# Patient Record
Sex: Male | Born: 1970 | Race: White | Hispanic: No | Marital: Single | Smoking: Former smoker
Health system: Southern US, Community
[De-identification: ages and names within clinical notes are randomized; demographics above are authoritative.]

## PROBLEM LIST (undated history)

## (undated) DIAGNOSIS — M6789 Other specified disorders of synovium and tendon, multiple sites: Secondary | ICD-10-CM

## (undated) DIAGNOSIS — I10 Essential (primary) hypertension: Secondary | ICD-10-CM

## (undated) DIAGNOSIS — M6749 Ganglion, multiple sites: Secondary | ICD-10-CM

## (undated) DIAGNOSIS — M7139 Other bursal cyst, multiple sites: Secondary | ICD-10-CM

## (undated) DIAGNOSIS — E785 Hyperlipidemia, unspecified: Secondary | ICD-10-CM

## (undated) HISTORY — DX: Other specified disorders of synovium and tendon, multiple sites: M67.89

## (undated) HISTORY — DX: Essential (primary) hypertension: I10

## (undated) HISTORY — PX: ORTHOPEDIC SURGERY: SHX850

## (undated) HISTORY — DX: Hyperlipidemia, unspecified: E78.5

## (undated) HISTORY — DX: Ganglion, multiple sites: M67.49

## (undated) HISTORY — DX: Other bursal cyst, multiple sites: M71.39

---

## 2005-06-02 ENCOUNTER — Emergency Department (HOSPITAL_COMMUNITY): Admission: EM | Admit: 2005-06-02 | Discharge: 2005-06-02 | Payer: Self-pay | Admitting: Emergency Medicine

## 2010-01-04 ENCOUNTER — Encounter: Admission: RE | Admit: 2010-01-04 | Discharge: 2010-01-04 | Payer: Self-pay | Admitting: Occupational Medicine

## 2010-12-26 ENCOUNTER — Ambulatory Visit (HOSPITAL_BASED_OUTPATIENT_CLINIC_OR_DEPARTMENT_OTHER)
Admission: RE | Admit: 2010-12-26 | Discharge: 2010-12-26 | Disposition: A | Discharge status: Home / Self Care | Payer: Worker's Compensation | Source: Ambulatory Visit | Attending: Orthopedic Surgery | Admitting: Orthopedic Surgery

## 2010-12-26 DIAGNOSIS — M6740 Ganglion, unspecified site: Secondary | ICD-10-CM | POA: Insufficient documentation

## 2010-12-26 DIAGNOSIS — Z01812 Encounter for preprocedural laboratory examination: Secondary | ICD-10-CM | POA: Insufficient documentation

## 2010-12-26 LAB — POCT I-STAT, CHEM 8
Chloride: 102 mEq/L (ref 96–112)
HCT: 44 % (ref 39.0–52.0)
Hemoglobin: 15 g/dL (ref 13.0–17.0)

## 2010-12-26 LAB — POCT HEMOGLOBIN-HEMACUE: Hemoglobin: 15.2 g/dL (ref 13.0–17.0)

## 2010-12-29 NOTE — Op Note (Signed)
NAMEKAGE, WILLMANN NO.:  [id number]  MEDICAL RECORD NO.:  [id number]  LOCATION:                                 FACILITY:  PHYSICIAN:  Katy Fitch. Riti Rollyson, M.D.      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  OPERATING SURGEON:  Katy Fitch. Shakyia Bosso, MD  ASSISTANT:  None.  ANESTHESIA:  General by LMA.  Supervising anesthesiologist is Burna Forts, MD  INDICATIONS:  Drew Cabrera is a 40 year old, employed at the [employer], referred by [facility] for evaluation and management of a painful right wrist.  Clinical examination revealed a fullness on the dorsum of the wrist, suggesting possible myxoid cyst formation within the capsule intracapsularly or deep to the extensor mechanisms.  Due to persistent pain, we advised Drew Cabrera to obtain an MRI to look for signs of occult wrist pathology.  The MRI did document a multiseptated myxoid cyst predicament on the dorsal aspect of the carpus.  This was centered near the neck of the capitate.  We advised Drew Cabrera to endure this as long as he is able to but when his symptoms became intolerable, his cyst could be excised without difficulty.  After informed consent, he was brought to the operating room at this time.  Preoperatively, he was reminded that myxoid cysts have tendency to recur. There is a poor understanding of the basic pathophysiology.  They appeared to be degenerative in nature due to alteration in  collagen metabolism.  Preoperatively, he was advised of potential risks, benefits of anesthesia by Dr. Jacklynn Bue of Anesthesia Service.  General anesthesia by LMA technique was recommended and accepted.  DESCRIPTION OF PROCEDURE:  Drew Cabrera was brought to room 2 of the [facility] and placed in supine position on the operating table.  Following the induction of general anesthesia by LMA technique, the right wrist was prepped with Betadine soap solution, sterilely draped. Pneumatic tourniquet was applied to proximal right brachium.  On exsanguination of the right arm with Esmarch bandage, the arterial tourniquet was inflated to 250 mmHg.  Following routine surgical time- out, procedure commenced with a transverse incision directly over the dorsum of the capitate.  Subcutaneous tissues were carefully divided, taking care to identify the extensor retinaculum.  This was split transversely and the extensor tendon in the second and fourth dorsal compartment retracted radially and ulnarly.  The dorsal capsule was identified.  The dorsal carpal arch identified and spared.  A transverse capsulotomy was performed and with great care, a complex cluster of 3 myxoid cyst was identified, circumferentially dissected and removed off the capsule of the midcarpal joint.  The area of mucin infiltration was resected with the capsular tissues.  Care was taken to explore the entire dorsum of the  wrist including the region of the scapholunate ligament in an effort to be certain that we saw all portions of the cyst noted on the preoperative MRI.  After thorough debridement, bipolar cautery forceps were used for hemostasis.  The dorsal carpal arch was preserved as were all of its branches to the carpal bones.  The wound was then repaired with subcutaneous suture of 4-0 Vicryl and intradermal 3-0 Prolene suture.  A compressive dressing was applied with volar plaster splint maintaining wrist  in 5 degrees of dorsiflexion.  For aftercare, [name] was provided prescription for Percocet 5 mg 1 p.o. q.4-6 hours p.r.n. pain, 30 tablets without refill.     Katy Fitch Lexis Potenza, M.D.     RVS/MEDQ  D:  12/26/2010  T:  12/27/2010  Job:  [id number]  Electronically Signed by Josephine Igo M.D. on 12/29/2010 08:42:30 AM

## 2011-07-29 ENCOUNTER — Emergency Department (INDEPENDENT_AMBULATORY_CARE_PROVIDER_SITE_OTHER)
Admission: EM | Admit: 2011-07-29 | Discharge: 2011-07-29 | Disposition: A | Discharge status: Home / Self Care | Payer: 59 | Source: Home / Self Care | Attending: Emergency Medicine | Admitting: Emergency Medicine

## 2011-07-29 DIAGNOSIS — J069 Acute upper respiratory infection, unspecified: Secondary | ICD-10-CM

## 2011-07-29 MED ORDER — AZITHROMYCIN 250 MG PO TABS: ORAL_TABLET | ORAL | Status: AC

## 2011-07-29 NOTE — ED Provider Notes (Signed)
History     CSN: [id number]  Arrival date & time 07/29/11  1542   First MD Initiated Contact with Patient 07/29/11 1603      No chief complaint on file.   (Consider location/radiation/quality/duration/timing/severity/associated sxs/prior treatment) HPI Wing is a 41 y.o. male who complains of onset of cold symptoms for 7 days. Worsening last 3 days. +sore throat / hoarseness + cough No pleuritic pain No wheezing + nasal congestion + post-nasal drainage + sinus pain/pressure No chest congestion No itchy/red eyes No earache No hemoptysis No SOB No chills/sweats No fever No nausea No vomiting No abdominal pain No diarrhea No skin rashes No fatigue No myalgias No headache    No past medical history on file.  No past surgical history on file.  No family history on file.  History  Substance Use Topics  . Smoking status: Not on file  . Smokeless tobacco: Not on file  . Alcohol Use: Not on file      Review of Systems  All other systems reviewed and are negative.    Allergies  Review of patient's allergies indicates not on file.  Home Medications  No current outpatient prescriptions on file.  There were no vitals taken for this visit.  Physical Exam  Nursing note and vitals reviewed. Constitutional: He is oriented to person, place, and time. He appears well-developed and well-nourished.  HENT:  Head: Normocephalic and atraumatic.  Right Ear: Tympanic membrane, external ear and ear canal normal.  Left Ear: Tympanic membrane, external ear and ear canal normal.  Nose: Mucosal edema and rhinorrhea present.  Mouth/Throat: Posterior oropharyngeal erythema present. No oropharyngeal exudate or posterior oropharyngeal edema.  Eyes: No scleral icterus.  Neck: Neck supple.  Cardiovascular: Regular rhythm and normal heart sounds.   Pulmonary/Chest: Effort normal and breath sounds normal. No respiratory distress.  Neurological: He is alert and oriented to  person, place, and time.  Skin: Skin is warm and dry.  Psychiatric: He has a normal mood and affect. His speech is normal.    ED Course  Procedures (including critical care time)  Labs Reviewed - No data to display No results found.   No diagnosis found.    MDM  1)  Take the prescribed antibiotic as instructed.  If not better in a few days, would Rx Prednisone. 2)  Use nasal saline solution (over the counter) at least 3 times a day. 3)  Use over the counter decongestants like Zyrtec-D every 12 hours as needed to help with congestion.  If you have hypertension, do not take medicines with sudafed.  4)  Can take tylenol every 6 hours or motrin every 8 hours for pain or fever. 5)  Follow up with your primary doctor if no improvement in 5-7 days, sooner if increasing pain, fever, or new symptoms.       Lily Kocher, MD 07/29/11 [phone]

## 2011-07-29 NOTE — ED Notes (Signed)
Pt c/o head and chest congestion for past week.  Using mucinex at home with no relief.

## 2011-12-11 ENCOUNTER — Ambulatory Visit (INDEPENDENT_AMBULATORY_CARE_PROVIDER_SITE_OTHER): Payer: 59 | Admitting: Family Medicine

## 2011-12-11 ENCOUNTER — Encounter: Payer: Self-pay | Admitting: Family Medicine

## 2011-12-11 VITALS — BP 152/94 | HR 68 | Ht 69.0 in | Wt 201.0 lb

## 2011-12-11 DIAGNOSIS — G5602 Carpal tunnel syndrome, left upper limb: Secondary | ICD-10-CM | POA: Insufficient documentation

## 2011-12-11 DIAGNOSIS — I10 Essential (primary) hypertension: Secondary | ICD-10-CM

## 2011-12-11 DIAGNOSIS — M79609 Pain in unspecified limb: Secondary | ICD-10-CM

## 2011-12-11 DIAGNOSIS — G8929 Other chronic pain: Secondary | ICD-10-CM

## 2011-12-11 DIAGNOSIS — M67439 Ganglion, unspecified wrist: Secondary | ICD-10-CM | POA: Insufficient documentation

## 2011-12-11 DIAGNOSIS — M6740 Ganglion, unspecified site: Secondary | ICD-10-CM

## 2011-12-11 DIAGNOSIS — M67441 Ganglion, right hand: Secondary | ICD-10-CM

## 2011-12-11 DIAGNOSIS — E785 Hyperlipidemia, unspecified: Secondary | ICD-10-CM

## 2011-12-11 MED ORDER — MELOXICAM 15 MG PO TABS: 15.0000 mg | ORAL_TABLET | Freq: Every day | ORAL | Status: DC

## 2011-12-11 MED ORDER — TELMISARTAN-HCTZ 40-12.5 MG PO TABS: 1.0000 | ORAL_TABLET | Freq: Every day | ORAL | Status: DC

## 2011-12-11 MED ORDER — HYDROCODONE-ACETAMINOPHEN 5-325 MG PO TABS: 1.0000 | ORAL_TABLET | Freq: Four times a day (QID) | ORAL | Status: DC | PRN

## 2011-12-11 MED ORDER — ASPIRIN 81 MG PO TABS: 81.0000 mg | ORAL_TABLET | Freq: Every day | ORAL | Status: DC

## 2011-12-11 NOTE — Patient Instructions (Signed)
Triglycerides, TG, TRIG This is a test to check your risk of developing heart disease. It is often done as part of a lipid profile during a regular medical exam or if you are being treated for high triglycerides. This test measures the amount of triglycerides in your blood. Triglycerides are the body's storage form for fat. Most triglycerides are found in fat tissue. Some triglycerides circulate in the blood to provide fuel for muscles to work. Extra triglycerides are found in the blood after eating a meal when fat is being sent from the gut to fat tissue for storage. The test for triglycerides should be done when you are fasting and no extra triglycerides from a recent meal are present.  SAMPLE COLLECTION The test for triglycerides uses a blood sample. Most often, the blood sample is collected using a needle to collect blood from a vein. Sometimes triglycerides are measured using a drop of blood collected by puncturing the skin on a finger. Testing should be done when you are fasting. For 12 to 14 hours before the test, only water is permitted. In addition, alcohol should not be consumed for the 24 hours just before the test. If you are diabetic and your blood sugar is out of control, triglycerides will be very high. NORMAL FINDINGS  Adult/elderly   Male: 40-160 mg/dL or 1.61-0.96 mmol/L (SI units)   Male: 35-135 mg/dL or 0.45-4.09 mmol/L (SI units)   0-42 years   Male: 30-86 mg/dL   Male: 81-19 mg/dL   1-42 years   Male: 82-956 mg/dL   Male: 21-308 mg/dL   65-42 years   Male: 46-962 mg/dL   Male: 95-284 mg/dL   13-42 years   Male: 40-102 mg/dL   Male: 72-536 mg/dL  Ranges for normal findings may vary among different laboratories and hospitals. You should always check with your doctor after having lab work or other tests done to discuss the meaning of your test results and whether your values are considered within normal limits. MEANING OF TEST  Your caregiver will go  over the test results with you and discuss the importance and meaning of your results, as well as treatment options and the need for additional tests if necessary. OBTAINING THE TEST RESULTS It is your responsibility to obtain your test results. Ask the lab or department performing the test when and how you will get your results. Document Released: 07/07/2004 Document Revised: 05/24/2011 Document Reviewed: 05/16/2008 [facility] Patient Information 2012 [place], [place].

## 2011-12-11 NOTE — Progress Notes (Signed)
Subjective:    Patient ID: Drew Cabrera, male    DOB: 06-Apr-1971, 41 y.o.   MRN: [id number]  Hypertension This is a new problem. The current episode started 1 to 4 weeks ago. The problem is unchanged. The problem is uncontrolled. Pertinent negatives include no anxiety, blurred vision, chest pain, headaches, malaise/fatigue, neck pain, orthopnea, palpitations, peripheral edema, PND, shortness of breath or sweats. There are no associated agents to hypertension. Risk factors: stress test  2 years ago. Past treatments include nothing. Compliance problems include exercise.  Hypertensive end-organ damage includes left ventricular hypertrophy. There is no history of angina, kidney disease, CAD/MI, CVA, heart failure, PVD, renovascular disease, retinopathy or a thyroid problem. There is no history of chronic renal disease, coarctation of the aorta, hyperaldosteronism, hypercortisolism, hyperparathyroidism, a hypertension causing med, pheochromocytoma or sleep apnea.  Hyperlipidemia This is a new problem. The current episode started more than 1 month ago. The problem is uncontrolled. Recent lipid tests were reviewed and are high. He has no history of chronic renal disease. Pertinent negatives include no chest pain or shortness of breath. He is currently on no antihyperlipidemic treatment. There are no compliance problems.  Risk factors for coronary artery disease include dyslipidemia, family history and a sedentary lifestyle.   #3 he also reports ganglion present over his right wrist that he states he followed up by his orthopedic who has done surgery on him for the ganglion within the last 2 years. #4 multiple joint pain especially wrist hand. Bowel ganglion with part of the injury work using air compresses he reports some pain in his ankle that has occurred from injuries where he almost lost his right foot to pain in both hands due to overuse at work. Reports sometimes when he comes homes he will have pain in  both hands after a day of work. One of his previous doctor he states would give him 300  tablets which would use overuse time he is in the 100 tablets in a year. He states that using the 200 pharmacy and this medicine that he takes every day. He has been taking aspirin 325 mg 2-3 times a day for his pain currently.  Review of Systems  Constitutional: Negative for malaise/fatigue.  HENT: Negative for neck pain.   Eyes: Negative for blurred vision.  Respiratory: Negative for shortness of breath.   Cardiovascular: Negative for chest pain, palpitations, orthopnea and PND.  Musculoskeletal:       R hand ganglion  Neurological: Negative for headaches.    Allergies  Allergen Reactions  . Ivp Dye (Iodinated Diagnostic Agents)    History   Social History  . Marital Status: Single    Spouse Name: N/A    Number of Children: N/A  . Years of Education: N/A   Occupational History  . Not on file.   Social History Main Topics  . Smoking status: Former Smoker    Types: Cigarettes    Quit date: 04/21/2008  . Smokeless tobacco: Former Neurosurgeon  . Alcohol Use: No  . Drug Use: Not on file  . Sexually Active: Yes   Other Topics Concern  . Not on file   Social History Narrative  . No narrative on file   Family History  Problem Relation Age of Onset  . Thyroid disease Mother   . Stroke Father    Past Medical History  Diagnosis Date  . Ganglion and cyst of synovium, tendon and bursa   . Hypertension   . Hyperlipidemia  Past Surgical History  Procedure Date  . Orthopedic surgery        BP 152/94  Pulse 68  Ht 5\' 9"  (1.753 m)  Wt 201 lb (91.173 kg)  BMI 29.68 kg/m2  SpO2 98% Objective:   Physical Exam  Constitutional: He is oriented to person, place, and time. He appears well-developed and well-nourished.  HENT:  Head: Normocephalic.  Eyes: Pupils are equal, round, and reactive to light.  Neck: Neck supple.  Cardiovascular: Normal rate, regular rhythm and normal heart  sounds.   Pulmonary/Chest: Effort normal.  Musculoskeletal:       Ganglion is present over the right wrist. No marked tenderness is palpated over the hands  The patient has trouble with at this time.  Mark surgical changes present over his ankles.    Neurological: He is alert and oriented to person, place, and time.  Skin: Skin is warm and dry.  Psychiatric: He has a normal mood and affect. His behavior is normal.       Assessment & Plan:  #1 hypertension. In reviewing his documentation from work and blood pressure today I feel comfortable with the diagnosis of hypertension. We will place him on Micardis return in 2 months for followup. Discussed with him how we place on low dose of multiple agents to treat hypertension. Also went over potential side effects #2 hyperlipidemia. Patient was not truly fasting but is doubtful that milk and cereal and [facility] caused triglycerides of over 600. Once again time spent talking to him about diet and the importance of lowering his triglycerides and cholesterol. Before we place her on medication I will recheck his cholesterol again. He is to return fasting to have blood work done #3 ganglion of the wrist is going to followup with his orthopedic who did initial surgery #4 chronic hand and joint pain most work will give him a prescription of hydrocodone 5-25 #30 And allow to fill once a month. We'll stop his aspirin to 3 times a day place on Mobic 15 mg a day and change aspirin to 81 mg daily to prevent strokes and cardiovascular events. Return in 2 months for followup. Extensive time was spent in counseling of above issues.

## 2011-12-12 LAB — COMPLETE METABOLIC PANEL WITH GFR
ALT: 45 U/L (ref 0–53)
AST: 35 U/L (ref 0–37)
Albumin: 4.9 g/dL (ref 3.5–5.2)
Alkaline Phosphatase: 88 U/L (ref 39–117)
BUN: 14 mg/dL (ref 6–23)
CO2: 26 mEq/L (ref 19–32)
Calcium: 9.9 mg/dL (ref 8.4–10.5)
Chloride: 101 mEq/L (ref 96–112)
Creat: 1.21 mg/dL (ref 0.50–1.35)
GFR, Est African American: 85 mL/min
GFR, Est Non African American: 74 mL/min
Glucose, Bld: 91 mg/dL (ref 70–99)
Potassium: 4.1 mEq/L (ref 3.5–5.3)
Sodium: 139 mEq/L (ref 135–145)
Total Bilirubin: 0.7 mg/dL (ref 0.3–1.2)
Total Protein: 7.3 g/dL (ref 6.0–8.3)

## 2011-12-12 LAB — HEMOGLOBIN A1C: Hgb A1c MFr Bld: 5.5 % (ref <5.7)

## 2011-12-12 LAB — LIPID PANEL
Cholesterol: 201 mg/dL — ABNORMAL HIGH (ref 0–200)
HDL: 28 mg/dL — ABNORMAL LOW (ref >39)
Total CHOL/HDL Ratio: 7.2 Ratio
Triglycerides: 628 mg/dL — ABNORMAL HIGH (ref <150)

## 2011-12-12 LAB — CBC WITH DIFFERENTIAL/PLATELET
Basophils Relative: 1 % (ref 0–1)
Lymphocytes Relative: 28 % (ref 12–46)
Lymphs Abs: 1.3 10*3/uL (ref 0.7–4.0)
MCHC: 35.6 g/dL (ref 30.0–36.0)
Monocytes Absolute: 0.4 10*3/uL (ref 0.1–1.0)
Monocytes Relative: 8 % (ref 3–12)
Neutro Abs: 2.8 10*3/uL (ref 1.7–7.7)
Neutrophils Relative %: 60 % (ref 43–77)
RDW: 14.1 % (ref 11.5–15.5)

## 2011-12-12 LAB — TSH: TSH: 32.416 u[IU]/mL — ABNORMAL HIGH (ref 0.350–4.500)

## 2012-02-12 ENCOUNTER — Ambulatory Visit (INDEPENDENT_AMBULATORY_CARE_PROVIDER_SITE_OTHER): Payer: 59 | Admitting: Family Medicine

## 2012-02-12 ENCOUNTER — Encounter: Payer: Self-pay | Admitting: Family Medicine

## 2012-02-12 VITALS — BP 106/72 | HR 64 | Ht 69.0 in | Wt 200.0 lb

## 2012-02-12 DIAGNOSIS — Z23 Encounter for immunization: Secondary | ICD-10-CM

## 2012-02-12 DIAGNOSIS — E789 Disorder of lipoprotein metabolism, unspecified: Secondary | ICD-10-CM

## 2012-02-12 DIAGNOSIS — R252 Cramp and spasm: Secondary | ICD-10-CM

## 2012-02-12 DIAGNOSIS — E039 Hypothyroidism, unspecified: Secondary | ICD-10-CM

## 2012-02-12 DIAGNOSIS — I10 Essential (primary) hypertension: Secondary | ICD-10-CM

## 2012-02-12 MED ORDER — HYDROCODONE-ACETAMINOPHEN 5-325 MG PO TABS: 1.0000 | ORAL_TABLET | Freq: Four times a day (QID) | ORAL | Status: DC | PRN

## 2012-02-12 MED ORDER — MAGNESIUM OXIDE 400 MG PO TABS: 800.0000 mg | ORAL_TABLET | Freq: Two times a day (BID) | ORAL | Status: AC | PRN

## 2012-02-12 MED ORDER — LEVOTHYROXINE SODIUM 75 MCG PO TABS: 75.0000 ug | ORAL_TABLET | Freq: Every day | ORAL | Status: DC

## 2012-02-12 MED ORDER — FENOFIBRATE 150 MG PO CAPS: 30.0000 | ORAL_CAPSULE | Freq: Every day | ORAL | Status: DC

## 2012-02-12 NOTE — Progress Notes (Signed)
Subjective:    Patient ID: Drew Cabrera, male    DOB: [birth year], 41 y.o.   MRN: [id number]  HPI #1 leg cramps/ muscle cramps he reports having some leg cramps muscle cramps. States that the legs do not draw up completely his legs feel as if they are about too cramp.  #2 overall leg pain and hand pain seemed to improve on the Mobic. He reports some GI discomfort the first 2 days and taking the Mobic but now he takes it in the evening and he seems to be doing fine. He does report some pain in the palmar surface of his hand after active use of his hand at work and sometimes his fingers lock up. Discussed about cortisone injections for possible trigger point but he's not too thrilled with that.   #3 hypertension. He reports in 2 days of reduction improvement of his blood pressure reports some decrease sexual excitement but nothing that interferes with his sex life at this time. He did state he was very impressed with the reduction of his blood pressure that he found when he checked it at work.  #4 blood work showed hypothyroidism. His mother had a history of hypothyroidism but he has never been diagnosed for that  #5 hyperlipidemia. Elevated triglycerides have been found before. But he is not taking any medications for that at this time.  #6 according to maintenance status he is due for a tetanus shot he is sure that he has had one since 1992 but since he cannot give [place] the date he will go ahead and get one today.   Review of Systems    BP 106/72  Pulse 64  Ht 5\' 9"  (1.753 m)  Wt 200 lb (90.719 kg)  BMI 29.53 kg/m2  SpO2 96% Objective:   Physical Exam  Vitals reviewed. Constitutional: He is oriented to person, place, and time. He appears well-developed and well-nourished.  HENT:  Head: Normocephalic and atraumatic.  Cardiovascular: Normal rate, regular rhythm and normal heart sounds.   Musculoskeletal: Normal range of motion.  Neurological: He is alert and oriented to person, place,  and time.  Skin: Skin is warm and dry.  Psychiatric: He has a normal mood and affect.        Results for orders placed in visit on 12/11/11  LIPID PANEL      Component Value Range   Cholesterol 201 (*) 0 - 200 mg/dL   Triglycerides 409 (*) <150 mg/dL   HDL 28 (*) >81 mg/dL   Total CHOL/HDL Ratio 7.2     VLDL NOT CALC  0 - 40 mg/dL   LDL Cholesterol      COMPLETE METABOLIC PANEL WITH GFR      Component Value Range   Sodium 139  135 - 145 mEq/L   Potassium 4.1  3.5 - 5.3 mEq/L   Chloride 101  96 - 112 mEq/L   CO2 26  19 - 32 mEq/L   Glucose, Bld 91  70 - 99 mg/dL   BUN 14  6 - 23 mg/dL   Creat 1.91  4.78 - 2.95 mg/dL   Total Bilirubin 0.7  0.3 - 1.2 mg/dL   Alkaline Phosphatase 88  39 - 117 U/L   AST 35  0 - 37 U/L   ALT 45  0 - 53 U/L   Total Protein 7.3  6.0 - 8.3 g/dL   Albumin 4.9  3.5 - 5.2 g/dL   Calcium 9.9  8.4 - 62.1 mg/dL  GFR, Est African American 85     GFR, Est Non African American 74    CBC WITH DIFFERENTIAL      Component Value Range   WBC 4.6  4.0 - 10.5 K/uL   RBC 5.71  4.22 - 5.81 MIL/uL   Hemoglobin 16.5  13.0 - 17.0 g/dL   HCT 95.6  38.7 - 56.4 %   MCV 81.1  78.0 - 100.0 fL   MCH 28.9  26.0 - 34.0 pg   MCHC 35.6  30.0 - 36.0 g/dL   RDW 33.2  95.1 - 88.4 %   Platelets 226  150 - 400 K/uL   Neutrophils Relative 60  43 - 77 %   Neutro Abs 2.8  1.7 - 7.7 K/uL   Lymphocytes Relative 28  12 - 46 %   Lymphs Abs 1.3  0.7 - 4.0 K/uL   Monocytes Relative 8  3 - 12 %   Monocytes Absolute 0.4  0.1 - 1.0 K/uL   Eosinophils Relative 3  0 - 5 %   Eosinophils Absolute 0.1  0.0 - 0.7 K/uL   Basophils Relative 1  0 - 1 %   Basophils Absolute 0.0  0.0 - 0.1 K/uL   Smear Review Criteria for review not met    TSH      Component Value Range   TSH 32.416 (*) 0.350 - 4.500 uIU/mL  HEMOGLOBIN A1C      Component Value Range   Hemoglobin A1C 5.5  <5.7 %   Mean Plasma Glucose 111  <117 mg/dL   vi Assessment & Plan:   #1 leg cramps and muscle cramps. I'm going to  recommend mag oxide that he can get definite [facility] 2 tablets a day for the next 3-4 days and then use on a when necessary basis he cannot stay to twice a day if need be but anytime he has cramps recommending at least one dose that day   #2 leg pain. The leg pain is actually better with the Mobic and has actually helped the hands 2 he does have some trigger point pain in the hand as if he has a trigger joint. May consider referring him to Dr. Karie Schwalbe. her for injections of those trigger points otherwise he can keep his the Mobic and Vicodin as needed.   #3 hypertension. Micardis she not causing side effects and has his blood pressure down which is great we'll continue him on the Micardis 40/12.5   #4 hypothyroidism. TSH was 32.4 indicating a pretty significant hypothyroidism. That may also cause trouble with his metabolism so I'm going to recommend Synthroid 0.75 mg or 75 mcg one tablet a day and recheck his thyroid panel are TSH in about 3 months and see if it makes a difference.  #5 hyperlipidemia. He was concerned about his liver enzymes since one tablet elevated but last lab work and look fine. But his triglycerides is over 600 so I'm going to recommendfenofibrate or one of the similar drugs to that to get started on lowering his triglycerides. He may want up on 2 medications to get the triglycerides back to more normal level. Will start him on Lipofen 150 mg a day  #6 immunization needs. Patient needs a tetanus immunization.  Followup in 3-4 months. We'll probably get TSH and triglycerides done at that time.

## 2012-02-12 NOTE — Patient Instructions (Addendum)
Leg Cramps Leg cramps that occur during exercise can be caused by poor circulation or dehydration. However, muscle cramps that occur at rest or during the night are usually not due to any serious medical problem. Heat cramps may cause muscle spasms during hot weather.  CAUSES There is no clear cause for muscle cramps. However, dehydration may be a factor for those who do not drink enough fluids and those who exercise in the heat. Imbalances in the level of sodium, potassium, calcium or magnesium in the muscle tissue may also be a factor. Some medications, such as water pills (diuretics), may cause loss of chemicals that the body needs (like sodium and potassium) and cause muscle cramps. TREATMENT   Make sure your diet has enough fluids and essential minerals for the muscle to work normally.   Avoid strenuous exercise for several days if you have been having frequent leg cramps.   Stretch and massage the cramped muscle for several minutes.   Some medicines may be helpful in some patients with night cramps. Only take over-the-counter or prescription medicines as directed by your caregiver.  SEEK IMMEDIATE MEDICAL CARE IF:   Your leg cramps become worse.   Your foot becomes cold, numb, or blue.  Document Released: 07/12/2004 Document Revised: 05/24/2011 Document Reviewed: 06/29/2008 [facility] Patient Information 2012 [place], [place].Triglycerides, TG, TRIG This is a test to check your risk of developing heart disease. It is often done as part of a lipid profile during a regular medical exam or if you are being treated for high triglycerides. This test measures the amount of triglycerides in your blood. Triglycerides are the body's storage form for fat. Most triglycerides are found in fat tissue. Some triglycerides circulate in the blood to provide fuel for muscles to work. Extra triglycerides are found in the blood after eating a meal when fat is being sent from the gut to fat tissue for storage.  The test for triglycerides should be done when you are fasting and no extra triglycerides from a recent meal are present.  SAMPLE COLLECTION The test for triglycerides uses a blood sample. Most often, the blood sample is collected using a needle to collect blood from a vein. Sometimes triglycerides are measured using a drop of blood collected by puncturing the skin on a finger. Testing should be done when you are fasting. For 12 to 14 hours before the test, only water is permitted. In addition, alcohol should not be consumed for the 24 hours just before the test. If you are diabetic and your blood sugar is out of control, triglycerides will be very high. NORMAL FINDINGS  Adult/elderly   Male: 40-160 mg/dL or 1.32-4.40 mmol/L (SI units)   Male: 35-135 mg/dL or 1.02-7.25 mmol/L (SI units)   0-42 years   Male: 30-86 mg/dL   Male: 36-64 mg/dL   4-42 years   Male: 47-425 mg/dL   Male: 95-638 mg/dL   75-42 years   Male: 33-295 mg/dL   Male: 18-841 mg/dL   66-42 years   Male: 30-160 mg/dL   Male: 10-932 mg/dL  Ranges for normal findings may vary among different laboratories and hospitals. You should always check with your doctor after having lab work or other tests done to discuss the meaning of your test results and whether your values are considered within normal limits. MEANING OF TEST  Your caregiver will go over the test results with you and discuss the importance and meaning of your results, as well as treatment options and the need  for additional tests if necessary. OBTAINING THE TEST RESULTS It is your responsibility to obtain your test results. Ask the lab or department performing the test when and how you will get your results. Document Released: 07/07/2004 Document Revised: 05/24/2011 Document Reviewed: 05/16/2008 [facility] Patient Information 2012 [place], [place].Hypothyroidism The thyroid is a large gland located in the lower front of your neck. The thyroid  gland helps control metabolism. Metabolism is how your body handles food. It controls metabolism with the hormone thyroxine. When this gland is underactive (hypothyroid), it produces too little hormone.  CAUSES These include:   Absence or destruction of thyroid tissue.   Goiter due to iodine deficiency.   Goiter due to medications.   Congenital defects (since birth).   Problems with the pituitary. This causes a lack of TSH (thyroid stimulating hormone). This hormone tells the thyroid to turn out more hormone.  SYMPTOMS  Lethargy (feeling as though you have no energy)   Cold intolerance   Weight gain (in spite of normal food intake)   Dry skin   Coarse hair   Menstrual irregularity (if severe, may lead to infertility)   Slowing of thought processes  Cardiac problems are also caused by insufficient amounts of thyroid hormone. Hypothyroidism in the newborn is cretinism, and is an extreme form. It is important that this form be treated adequately and immediately or it will lead rapidly to retarded physical and mental development. DIAGNOSIS  To prove hypothyroidism, your caregiver may do blood tests and ultrasound tests. Sometimes the signs are hidden. It may be necessary for your caregiver to watch this illness with blood tests either before or after diagnosis and treatment. TREATMENT  Low levels of thyroid hormone are increased by using synthetic thyroid hormone. This is a safe, effective treatment. It usually takes about four weeks to gain the full effects of the medication. After you have the full effect of the medication, it will generally take another four weeks for problems to leave. Your caregiver may start you on low doses. If you have had heart problems the dose may be gradually increased. It is generally not an emergency to get rapidly to normal. HOME CARE INSTRUCTIONS   Take your medications as your caregiver suggests. Let your caregiver know of any medications you are  taking or start taking. Your caregiver will help you with dosage schedules.   As your condition improves, your dosage needs may increase. It will be necessary to have continuing blood tests as suggested by your caregiver.   Report all suspected medication side effects to your caregiver.  SEEK MEDICAL CARE IF: Seek medical care if you develop:  Sweating.   Tremulousness (tremors).   Anxiety.   Rapid weight loss.   Heat intolerance.   Emotional swings.   Diarrhea.   Weakness.  SEEK IMMEDIATE MEDICAL CARE IF:  You develop chest pain, an irregular heart beat (palpitations), or a rapid heart beat. MAKE SURE YOU:   Understand these instructions.   Will watch your condition.   Will get help right away if you are not doing well or get worse.  Document Released: 06/04/2005 Document Revised: 05/24/2011 Document Reviewed: 01/23/2008 [facility] Patient Information 2012 [place], [place].

## 2012-02-22 ENCOUNTER — Telehealth: Payer: Self-pay | Admitting: Family Medicine

## 2012-02-22 MED ORDER — NIACIN ER (ANTIHYPERLIPIDEMIC) 500 MG PO TBCR: 500.0000 mg | EXTENDED_RELEASE_TABLET | Freq: Every day | ORAL | Status: DC

## 2012-02-22 NOTE — Telephone Encounter (Signed)
Patient came to encounter complaining that lipofen was causing loose stools and may or may not be causing chest pain patient was interested in trying alternate medication for triglyceride control. Samples of Niaspan given to the patient and instructed to call us should he like to continue this medication if it is tolerable.

## 2012-05-27 ENCOUNTER — Encounter: Payer: Self-pay | Admitting: Family Medicine

## 2012-05-27 ENCOUNTER — Ambulatory Visit (INDEPENDENT_AMBULATORY_CARE_PROVIDER_SITE_OTHER): Payer: 59 | Admitting: Family Medicine

## 2012-05-27 VITALS — BP 125/86 | HR 66 | Ht 69.0 in | Wt 199.0 lb

## 2012-05-27 DIAGNOSIS — M79643 Pain in unspecified hand: Secondary | ICD-10-CM

## 2012-05-27 DIAGNOSIS — M79609 Pain in unspecified limb: Secondary | ICD-10-CM

## 2012-05-27 DIAGNOSIS — E785 Hyperlipidemia, unspecified: Secondary | ICD-10-CM

## 2012-05-27 DIAGNOSIS — E039 Hypothyroidism, unspecified: Secondary | ICD-10-CM

## 2012-05-27 DIAGNOSIS — I10 Essential (primary) hypertension: Secondary | ICD-10-CM

## 2012-05-27 MED ORDER — NIACIN ER (ANTIHYPERLIPIDEMIC) 500 MG PO TBCR: 500.0000 mg | EXTENDED_RELEASE_TABLET | Freq: Every day | ORAL | Status: DC

## 2012-05-27 NOTE — Patient Instructions (Addendum)
Niacin tablets What is this medicine? NIACIN (NYE a sin) is used in combination with a healthy diet to lower 'bad' cholesterol and increase 'good' cholesterol. This medicine is also used to decrease triglycerides. If triglycerides are too high, you may be at risk of developing pancreatitis. This is a painful condition that causes inflammation of the pancreas and can lead to serious health problems. This medicine may be used for other purposes; ask your health care provider or pharmacist if you have questions. What should I tell my health care provider before I take this medicine? They need to know if you have any of these conditions: -bleeding problems -if you frequently drink alcohol containing drinks -liver disease -ulcers of intestine or stomach -an unusual or allergic reaction to niacin, other medicines, foods, dyes, or preservatives -pregnant or trying or get pregnant -breast-feeding How should I use this medicine? Take this medicine by mouth with a glass of water. Follow the directions on the prescription label. Take with a low-fat meal or snack. Take your medicine at regular intervals. Do not take your medicine more often than directed. Do not stop taking except on the advice of your doctor or health care professional. Talk to your pediatrician regarding the use of this medicine in children. Special care may be needed. Overdosage: If you think you have taken too much of this medicine contact a poison control center or emergency room at once. NOTE: This medicine is only for you. Do not share this medicine with others. What if I miss a dose? If you miss a dose, take it as soon as you can. If it is almost time for your next dose, take only that dose. Do not take double or extra doses. What may interact with this medicine? -aspirin -medicines for blood pressure, chest pain, or heart disease -nitroglycerin -nutritional supplements that contain niacin or nicotinamide -medicines for  cholesterol or triglycerides This list may not describe all possible interactions. Give your health care provider a list of all the medicines, herbs, non-prescription drugs, or dietary supplements you use. Also tell them if you smoke, drink alcohol, or use illegal drugs. Some items may interact with your medicine. What should I watch for while using this medicine? Visit your doctor or health care professional for regular checks on your progress. You may need regular tests to make sure your liver is working properly. You may get drowsy or dizzy. Do not drive, use machinery, or do anything that needs mental alertness until you know how this drug affects you. Do not stand or sit up quickly, especially if you are an older patient. This reduces the risk of dizzy or fainting spells. Do not drink hot drinks or alcohol at the same time you take this medicine. Hot drinks and alcohol can increase the flushing caused by this medicine, which can be uncomfortable. Alcohol also can increase possible dizziness. Taking aspirin or an NSAID like ibuprofen 30 minutes before this medicine may help reduce flushing. This drug is only part of a total heart-health program. Your doctor or a dietician can suggest a low-cholesterol and low-fat diet to help. Avoid alcohol and smoking, and keep a proper exercise schedule. If you are diabetic, close regulation and monitoring of your blood sugars can help your blood fat levels. This medicine may change the way your diabetic medication works, and sometimes will require that your dosages be adjusted. Check with your doctor or health care professional. What side effects may I notice from receiving this medicine? Side effects that  you should report to your doctor or health care professional as soon as possible: -allergic reactions like skin rash, itching or hives, swelling of the face, lips, or tongue -dark urine -feeling faint or lightheaded, falls -general ill feeling or flu-like  symptoms -light-colored stools -loss of appetite, nausea -palpitations -right upper belly pain -shortness of breath, wheezing -skin rash and itching -unusually weak or tired -yellowing of the eyes or skin Side effects that usually do not require medical attention (report to your doctor or health care professional if they continue or are bothersome): -diarrhea -headache -stomach discomfort or bloating This list may not describe all possible side effects. Call your doctor for medical advice about side effects. You may report side effects to FDA at 1-800-FDA-1088. Where should I keep my medicine? Keep out of the reach of children. Store at room temperature between 15 and 30 degrees C (59 and 86 degrees F). Keep container tightly closed. Throw away any unused medicine after the expiration date. NOTE: This sheet is a summary. It may not cover all possible information. If you have questions about this medicine, talk to your doctor, pharmacist, or health care provider.  2012, [employer]. (08/21/2007 4:23:36 PM)                  p

## 2012-05-27 NOTE — Progress Notes (Signed)
Subjective:    Patient ID: Drew Cabrera, male    DOB: 01/10/1971, 41 y.o.   MRN: [id number]  HPI #1 hypertension. No trouble with the Micardis 40/12.5. We'll continue on that dosage.  #2 hyperlipidemia. Patient was unable to tolerate the lipofen at all. He reports having mood swings feeling strange after taking the second dosage of the medication. He did notice that when he took the Niaspan he initially had no problems with the first 3 or 4 days but by the fifth day had extreme flushing of the face he took warm shower and that helped. He states he was told not to take aspirin with it but I checked and the recommendation is for him to take a full aspirin hour before the medication to help with flushing and vasodilatation. His we'll try it again. He also requests we repeat the lipid since he is on Synthroid medication.  #3 hypothyroidism we have him on Synthroid 0.075 mg. We should check a TSH and we can check a lipid at that time too.  #4 hand pain he reports pain in his hand is better since use of Vicodin he wants to make sure that he still will be will to get the Vicodin renewed. He is not taking a regular basis but does want to have that available when  he needs it. I did recommend he may want to consider getting a months worth and putting in a safe place at home so that he'll have it if he does have access to a new prescription. He is also taking Mobic with his other medication.   Review of Systems  All other systems reviewed and are negative.     BP 125/86  Pulse 66  Ht 5\' 9"  (1.753 m)  Wt 199 lb (90.266 kg)  BMI 29.39 kg/m2 Objective:   Physical Exam  Constitutional: He is oriented to person, place, and time. He appears well-developed and well-nourished.  HENT:  Head: Normocephalic and atraumatic.  Cardiovascular: Normal rate, regular rhythm and normal heart sounds.   Pulmonary/Chest: Effort normal and breath sounds normal. No respiratory distress. He has no wheezes.   Musculoskeletal: Normal range of motion.  Neurological: He is alert and oriented to person, place, and time.  Psychiatric: He has a normal mood and affect. His behavior is normal.       Office Visit on 12/11/2011  Component Date Value Range Status  . Cholesterol 12/11/2011 201* 0 - 200 mg/dL Final   Comment: ATP III Classification:                                < 200        mg/dL        Desirable                               200 - 239     mg/dL        Borderline High                               >= 240        mg/dL        High                             .  Triglycerides 12/11/2011 628* <150 mg/dL Final  . HDL 29/56/2130 28* >39 mg/dL Final  . Total CHOL/HDL Ratio 12/11/2011 7.2   Final  . VLDL 12/11/2011 NOT CALC  0 - 40 mg/dL Final   Comment:                            Not calculated due to Triglyceride >400.                          Suggest ordering Direct LDL (Unit Code: 86578).  . LDL Cholesterol 12/11/2011    Final   Comment:                            Not calculated due to Triglyceride >400.                          Suggest ordering Direct LDL (Unit Code: 46962).                                                     Total Cholesterol/HDL Ratio:CHD Risk                                                 Coronary Heart Disease Risk Table                                                                 Men       Women                                   1/2 Average Risk              3.4        3.3                                       Average Risk              5.0        4.4                                    2X Average Risk              9.6        7.1                                    3X Average Risk             23.4  11.0                          Use the calculated Patient Ratio above and the CHD Risk table                           to determine the patient's CHD Risk.                          ATP III Classification (LDL):                                < 100        mg/dL          Optimal                               100 - 129     mg/dL         Near or Above Optimal                               130 - 159     mg/dL         Borderline High                               160 - 189     mg/dL         High                                > 190        mg/dL         Very High                             . Sodium 12/11/2011 139  135 - 145 mEq/L Final  . Potassium 12/11/2011 4.1  3.5 - 5.3 mEq/L Final  . Chloride 12/11/2011 101  96 - 112 mEq/L Final  . CO2 12/11/2011 26  19 - 32 mEq/L Final  . Glucose, Bld 12/11/2011 91  70 - 99 mg/dL Final  . BUN 16/03/9603 14  6 - 23 mg/dL Final  . Creat 54/02/8118 1.21  0.50 - 1.35 mg/dL Final  . Total Bilirubin 12/11/2011 0.7  0.3 - 1.2 mg/dL Final  . Alkaline Phosphatase 12/11/2011 88  39 - 117 U/L Final  . AST 12/11/2011 35  0 - 37 U/L Final  . ALT 12/11/2011 45  0 - 53 U/L Final  . Total Protein 12/11/2011 7.3  6.0 - 8.3 g/dL Final  . Albumin 14/78/2956 4.9  3.5 - 5.2 g/dL Final  . Calcium 21/30/8657 9.9  8.4 - 10.5 mg/dL Final  . GFR, Est African American 12/11/2011 85   Final  . GFR, Est Non African American 12/11/2011 74   Final   Comment:                            The estimated GFR is a calculation valid for adults (18 to 42 years  old) that uses the CKD-EPI algorithm to adjust for age and sex. It                          is not to be used for children, pregnant women, hospitalized patients,                          patients on dialysis, or with rapidly changing kidney function.                          According to the NKDEP, eGFR >89 is normal, 60-89 shows mild                          impairment, 30-59 shows moderate impairment, 15-29 shows severe                          impairment and <15 is ESRD.  . WBC 12/11/2011 4.6  4.0 - 10.5 K/uL Final  . RBC 12/11/2011 5.71  4.22 - 5.81 MIL/uL Final  . Hemoglobin 12/11/2011 16.5  13.0 - 17.0 g/dL Final  . HCT 16/03/9603 46.3  39.0 - 52.0 % Final  .  MCV 12/11/2011 81.1  78.0 - 100.0 fL Final  . MCH 12/11/2011 28.9  26.0 - 34.0 pg Final  . MCHC 12/11/2011 35.6  30.0 - 36.0 g/dL Final  . RDW 54/02/8118 14.1  11.5 - 15.5 % Final  . Platelets 12/11/2011 226  150 - 400 K/uL Final  . Neutrophils Relative 12/11/2011 60  43 - 77 % Final  . Neutro Abs 12/11/2011 2.8  1.7 - 7.7 K/uL Final  . Lymphocytes Relative 12/11/2011 28  12 - 46 % Final  . Lymphs Abs 12/11/2011 1.3  0.7 - 4.0 K/uL Final  . Monocytes Relative 12/11/2011 8  3 - 12 % Final  . Monocytes Absolute 12/11/2011 0.4  0.1 - 1.0 K/uL Final  . Eosinophils Relative 12/11/2011 3  0 - 5 % Final  . Eosinophils Absolute 12/11/2011 0.1  0.0 - 0.7 K/uL Final  . Basophils Relative 12/11/2011 1  0 - 1 % Final  . Basophils Absolute 12/11/2011 0.0  0.0 - 0.1 K/uL Final  . Smear Review 12/11/2011 Criteria for review not met   Final  . TSH 12/11/2011 32.416* 0.350 - 4.500 uIU/mL Final  . Hemoglobin A1C 12/11/2011 5.5  <5.7 % Final   Comment:                                                                                                 According to the ADA Clinical Practice Recommendations for 2011, when                          HbA1c is used as a screening test:                                                       >=  6.5%   Diagnostic of Diabetes Mellitus                                     (if abnormal result is confirmed)                                                     5.7-6.4%   Increased risk of developing Diabetes Mellitus                                                     References:Diagnosis and Classification of Diabetes Mellitus,Diabetes                          Care,2011,34(Suppl 1):S62-S69 and Standards of Medical Care in                                  Diabetes - 2011,Diabetes Care,2011,34 (Suppl 1):S11-S61.                             . Mean Plasma Glucose 12/11/2011 111  <117 mg/dL Final   Assessment & Plan:  #1 hypertension. Blood pressure under great control 125/86 no  new changes.  #2 hyperlipidemia. We'll going to try the Niaspan once again 500 mg a day for about 2 months but if tolerated then increase to 2 capsules at night. Instruct patient to take a full aspirin 325 one to 2 hours before taking Niaspan and see if it helps with the side effects.  #3 hypothyroidism. TSH was ordered.   #4 hand pain continue using the Norco on a when necessary basis for pain and continue with Mobic.  Return in 4 months followup.

## 2012-05-30 LAB — COMPREHENSIVE METABOLIC PANEL
AST: 48 U/L — ABNORMAL HIGH (ref 0–37)
Alkaline Phosphatase: 110 U/L (ref 39–117)
BUN: 23 mg/dL (ref 6–23)
Calcium: 10 mg/dL (ref 8.4–10.5)
Potassium: 4.4 mEq/L (ref 3.5–5.3)
Sodium: 135 mEq/L (ref 135–145)

## 2012-05-30 LAB — LIPID PANEL
Cholesterol: 262 mg/dL — ABNORMAL HIGH (ref 0–200)
HDL: 22 mg/dL — ABNORMAL LOW (ref >39)
Total CHOL/HDL Ratio: 11.9 Ratio
VLDL: 514 mg/dL — ABNORMAL HIGH (ref 0–40)

## 2012-06-05 ENCOUNTER — Encounter: Payer: Self-pay | Admitting: Family Medicine

## 2012-06-05 ENCOUNTER — Ambulatory Visit (INDEPENDENT_AMBULATORY_CARE_PROVIDER_SITE_OTHER): Payer: 59 | Admitting: Family Medicine

## 2012-06-05 VITALS — BP 120/76 | HR 77 | Wt 202.0 lb

## 2012-06-05 DIAGNOSIS — IMO0001 Reserved for inherently not codable concepts without codable children: Secondary | ICD-10-CM

## 2012-06-05 DIAGNOSIS — M791 Myalgia, unspecified site: Secondary | ICD-10-CM

## 2012-06-05 DIAGNOSIS — E785 Hyperlipidemia, unspecified: Secondary | ICD-10-CM

## 2012-06-05 DIAGNOSIS — E039 Hypothyroidism, unspecified: Secondary | ICD-10-CM

## 2012-06-05 MED ORDER — NIACIN ER (ANTIHYPERLIPIDEMIC) 500 MG PO TBCR: 500.0000 mg | EXTENDED_RELEASE_TABLET | Freq: Every day | ORAL | Status: DC

## 2012-06-05 MED ORDER — ATORVASTATIN CALCIUM 20 MG PO TABS: 10.0000 mg | ORAL_TABLET | Freq: Every day | ORAL | Status: DC

## 2012-06-05 MED ORDER — LEVOTHYROXINE SODIUM 150 MCG PO TABS: 150.0000 ug | ORAL_TABLET | Freq: Every day | ORAL | Status: DC

## 2012-06-05 MED ORDER — MELOXICAM 15 MG PO TABS: 15.0000 mg | ORAL_TABLET | Freq: Every day | ORAL | Status: AC

## 2012-06-05 MED ORDER — TELMISARTAN-HCTZ 40-12.5 MG PO TABS: 1.0000 | ORAL_TABLET | Freq: Every day | ORAL | Status: DC

## 2012-06-05 NOTE — Progress Notes (Signed)
Subjective:    Patient ID: Drew Cabrera, male    DOB: 1971/06/05, 41 y.o.   MRN: [id number]  HPI Here for follow up lab work.   Review of Systems  All other systems reviewed and are negative.      BP 120/76  Pulse 77  Wt 202 lb (91.627 kg)  SpO2 96% Objective:   Physical Exam  Constitutional: He appears well-developed.  Neurological: He is alert.  Skin: Skin is warm.  Psychiatric: He has a normal mood and affect. His behavior is normal.      Office Visit on 05/27/2012  Component Date Value Range Status  . Cholesterol 05/27/2012 262* 0 - 200 mg/dL Final   Comment: ATP III Classification:                                < 200        mg/dL        Desirable                               200 - 239     mg/dL        Borderline High                               >= 240        mg/dL        High                             . Triglycerides 05/27/2012 2572* <150 mg/dL Final   Comment: Result repeated and verified.                          Result confirmed by automatic dilution.  Marland Kitchen HDL 05/27/2012 22* >39 mg/dL Final  . Total CHOL/HDL Ratio 05/27/2012 11.9   Final  . VLDL 05/27/2012 514* 0 - 40 mg/dL Final  . LDL Cholesterol 05/27/2012 NOT CALC  0 - 99 mg/dL Final   Comment:                            Total Cholesterol/HDL Ratio:CHD Risk                                                 Coronary Heart Disease Risk Table                                                                 Men       Women                                   1/2 Average Risk              3.4        3.3  Average Risk              5.0        4.4                                    2X Average Risk              9.6        7.1                                    3X Average Risk             23.4       11.0                          Use the calculated Patient Ratio above and the CHD Risk table                           to determine the patient's CHD Risk.   ATP III Classification (LDL):                                < 100        mg/dL         Optimal                               100 - 129     mg/dL         Near or Above Optimal                               130 - 159     mg/dL         Borderline High                               160 - 189     mg/dL         High                                > 190        mg/dL         Very High                             . Sodium 05/27/2012 135  135 - 145 mEq/L Final  . Potassium 05/27/2012 4.4  3.5 - 5.3 mEq/L Final  . Chloride 05/27/2012 99  96 - 112 mEq/L Final  . CO2 05/27/2012 23  19 - 32 mEq/L Final  . Glucose, Bld 05/27/2012 94  70 - 99 mg/dL Final  . BUN 16/03/9603 23  6 - 23 mg/dL Final  . Creat 54/02/8118 1.64* 0.50 - 1.35 mg/dL Final  . Total Bilirubin 05/27/2012 0.3  0.3 - 1.2 mg/dL Final  . Alkaline Phosphatase 05/27/2012 110  39 - 117 U/L Final  . AST 05/27/2012 48* 0 - 37 U/L Final  .  ALT 05/27/2012 55* 0 - 53 U/L Final  . Total Protein 05/27/2012 7.0  6.0 - 8.3 g/dL Final  . Albumin 56/21/3086 4.9  3.5 - 5.2 g/dL Final  . Calcium 57/84/6962 10.0  8.4 - 10.5 mg/dL Final  . TSH 95/28/4132 14.344* 0.350 - 4.500 uIU/mL Final      Assessment & Plan:  #1 hyperlipidemia/elevated cholesterol and triglycerides. Patient was very concerned and rightly so about his triglycerides.Tryglerides over 2000. Due to his HDL/cholesterol risk is greater than 2 we will place him on Lipitor 20 mg 1/2 tablet initially for the 1st month and then increase to a whole tablet.Restart the Niaspan ASAP and take w/ a full Asprin 1 hr before.  #2 hypothyroid. Double his synthroid dosage to .150mg .  #3 elevated liver enzymes. Mild and maybe to what is more likely a fatty liver.  #4 mild renal failure. Creatine up to 1.64.Hopefully there is some idiopathic reason for this and we will monitor this situation.  #5 hypertension. Under good control.  Extensive time of this 25 minute over 20 was in performing medical  education and information on ongoing medical problem.

## 2012-06-05 NOTE — Patient Instructions (Signed)
Triglycerides, TG, TRIG This is a test to check your risk of developing heart disease. It is often done as part of a lipid profile during a regular medical exam or if you are being treated for high triglycerides. This test measures the amount of triglycerides in your blood. Triglycerides are the body's storage form for fat. Most triglycerides are found in fat tissue. Some triglycerides circulate in the blood to provide fuel for muscles to work. Extra triglycerides are found in the blood after eating a meal when fat is being sent from the gut to fat tissue for storage. The test for triglycerides should be done when you are fasting and no extra triglycerides from a recent meal are present.  SAMPLE COLLECTION The test for triglycerides uses a blood sample. Most often, the blood sample is collected using a needle to collect blood from a vein. Sometimes triglycerides are measured using a drop of blood collected by puncturing the skin on a finger. Testing should be done when you are fasting. For 12 to 14 hours before the test, only water is permitted. In addition, alcohol should not be consumed for the 24 hours just before the test. If you are diabetic and your blood sugar is out of control, triglycerides will be very high. NORMAL FINDINGS  Adult/elderly  Male: 40-160 mg/dL or 4.78-2.95 mmol/L (SI units)  Male: 35-135 mg/dL or 6.21-3.08 mmol/L (SI units)  0-42 years  Male: 30-86 mg/dL  Male: 65-78 mg/dL  4-42 years  Male: 62-952 mg/dL  Male: 84-132 mg/dL  44-42 years  Male: 02-725 mg/dL  Male: 36-644 mg/dL  03-42 years  Male: 42-595 mg/dL  Male: 63-875 mg/dL Ranges for normal findings may vary among different laboratories and hospitals. You should always check with your doctor after having lab work or other tests done to discuss the meaning of your test results and whether your values are considered within normal limits. MEANING OF TEST  Your caregiver will go over the test  results with you and discuss the importance and meaning of your results, as well as treatment options and the need for additional tests if necessary. OBTAINING THE TEST RESULTS It is your responsibility to obtain your test results. Ask the lab or department performing the test when and how you will get your results. Document Released: 07/07/2004 Document Revised: 08/27/2011 Document Reviewed: 05/16/2008 [facility] Patient Information 2013 [place], [place]. Lipid Blood Tests Blood lipids are fats found in the circulation. Cholesterol and triglycerides are the two main lipids measured for determining your risk of getting heart and blood vessel diseases. The cholesterol in the blood is attached to two different molecules called lipoproteins. HDL is the beneficial high density lipoprotein cholesterol which reduces coronary risk. Low density lipoprotein cholesterol, the LDL, carries an increased risk for heart and vascular disease when it is high. Most of the body's fat tissue is in the form of triglycerides. Medical conditions that elevate the blood triglyceride level include diabetes, thyroid, kidney, and liver diseases.  A lipid panel usually includes the total cholesterol, LDL, and HDL blood levels. For best results, you should fast overnight before the blood tests are drawn. The following risk factors are generally accepted for different lipids:  LDL - Below 100 means low risk, 130-160 borderline risk, 160-190 high risk, above 190 is considered very high risk.  HDL - Below 40 means high risk, 40-60 average risk, 60 and higher means a reduced risk for heart and blood vessel diseases.  Total cholesterol - Below 200 means low  risk, 200-240 is borderline risk, above 240 is higher risk.  Triglycerides - Below 200 means low risk, 200-400 borderline risk, above 400 means increased risk. Many factors contribute to lipid levels including genetics, diet, exercise, body fat and medications. Reducing saturated fats  in the diet and increasing fiber with fruits, vegetables and whole grains have been shown to improve blood lipids. Drugs may be prescribed to lower lipids if diet and exercise alone do not help bring the cholesterol levels under control. Document Released: 07/12/2004 Document Revised: 08/27/2011 Document Reviewed: 06/04/2005 [facility] Patient Information 2013 [place], [place].

## 2012-12-01 ENCOUNTER — Ambulatory Visit (INDEPENDENT_AMBULATORY_CARE_PROVIDER_SITE_OTHER): Payer: 59 | Admitting: Sports Medicine

## 2012-12-01 ENCOUNTER — Encounter: Payer: Self-pay | Admitting: Sports Medicine

## 2012-12-01 VITALS — BP 167/98 | HR 65 | Wt 202.0 lb

## 2012-12-01 DIAGNOSIS — M19171 Post-traumatic osteoarthritis, right ankle and foot: Secondary | ICD-10-CM | POA: Insufficient documentation

## 2012-12-01 DIAGNOSIS — G8929 Other chronic pain: Secondary | ICD-10-CM

## 2012-12-01 DIAGNOSIS — M5136 Other intervertebral disc degeneration, lumbar region with discogenic back pain only: Secondary | ICD-10-CM | POA: Insufficient documentation

## 2012-12-01 DIAGNOSIS — M25579 Pain in unspecified ankle and joints of unspecified foot: Secondary | ICD-10-CM

## 2012-12-01 DIAGNOSIS — M545 Low back pain, unspecified: Secondary | ICD-10-CM

## 2012-12-01 DIAGNOSIS — I10 Essential (primary) hypertension: Secondary | ICD-10-CM

## 2012-12-01 DIAGNOSIS — Z299 Encounter for prophylactic measures, unspecified: Secondary | ICD-10-CM | POA: Insufficient documentation

## 2012-12-01 DIAGNOSIS — E039 Hypothyroidism, unspecified: Secondary | ICD-10-CM

## 2012-12-01 DIAGNOSIS — E785 Hyperlipidemia, unspecified: Secondary | ICD-10-CM

## 2012-12-01 MED ORDER — LISINOPRIL-HYDROCHLOROTHIAZIDE 20-25 MG PO TABS: 1.0000 | ORAL_TABLET | Freq: Every day | ORAL | Status: DC

## 2012-12-01 MED ORDER — HYDROCODONE-ACETAMINOPHEN 5-325 MG PO TABS: 1.0000 | ORAL_TABLET | Freq: Four times a day (QID) | ORAL | Status: DC | PRN

## 2012-12-01 MED ORDER — FENOFIBRATE 145 MG PO TABS: 145.0000 mg | ORAL_TABLET | Freq: Every day | ORAL | Status: DC

## 2012-12-01 NOTE — Assessment & Plan Note (Signed)
Lisinopril/Hydrochlorothiazide. Return in one month to recheck blood pressure.

## 2012-12-01 NOTE — Assessment & Plan Note (Signed)
Refilling pain medicine. X-rays. Home exercises. Return in 4 weeks, MRI if no better.

## 2012-12-01 NOTE — Assessment & Plan Note (Addendum)
Off of niacin and Lipitor due to side effects. Starting fenofibrate. Recheck lipids.  Triglycerides are still elevated, we will recheck after he has been on fenofibrate for 3 months.  Fenofibrate is too expensive, switching to Vascepa.discount coupon given.

## 2012-12-01 NOTE — Assessment & Plan Note (Signed)
Status post trauma and ORIF per patient, but no further details are available. It sounds as though he gets narcotics every month for this. X-rays, and we'll follow this up.

## 2012-12-01 NOTE — Assessment & Plan Note (Signed)
Checking routine bloodwork. 

## 2012-12-01 NOTE — Progress Notes (Signed)
  Subjective:    CC: Followup  HPI: Hypertension: Uncontrolled, Micardis/HCT is too expensive. Has been out for a while.  Hyperlipidemia: Predominantly hypertriglyceridemia: Intolerant of niacin and atorvastatin.  Hypothyroidism: Has been on Synthroid, has since stopped it.  Chronic right ankle pain: Has been given narcotics by Dr. Thurmond Butts on a regular basis. Requesting refill today.  Low back pain: Lower lumbar spine, no radiation, worse with sitting, worse with Valsalva, moderate, stable. No bowel or bladder dysfunction.  Past medical history, Surgical history, Family history not pertinant except as noted below, Social history, Allergies, and medications have been entered into the medical record, reviewed, and no changes needed.   Review of Systems: No fevers, chills, night sweats, weight loss, chest pain, or shortness of breath.   Objective:    General: Well Developed, well nourished, and in no acute distress.  Neuro: Alert and oriented x3, extra-ocular muscles intact, sensation grossly intact.  HEENT: Normocephalic, atraumatic, pupils equal round reactive to light, neck supple, no masses, no lymphadenopathy, thyroid nonpalpable.  Skin: Warm and dry, no rashes. Cardiac: Regular rate and rhythm, no murmurs rubs or gallops, no lower extremity edema.  Respiratory: Clear to auscultation bilaterally. Not using accessory muscles, speaking in full sentences. Back Exam:  Inspection: Unremarkable  Motion: Flexion 45 deg, Extension 45 deg, Side Bending to 45 deg bilaterally,  Rotation to 45 deg bilaterally  SLR laying: Negative  XSLR laying: Negative  Palpable tenderness: None. FABER: negative. Sensory change: Gross sensation intact to all lumbar and sacral dermatomes.  Reflexes: 2+ at both patellar tendons, 2+ at achilles tendons, Babinski's downgoing.  Strength at foot  Plantar-flexion: 5/5 Dorsi-flexion: 5/5 Eversion: 5/5 Inversion: 5/5  Leg strength  Quad: 5/5 Hamstring: 5/5 Hip  flexor: 5/5 Hip abductors: 5/5  Gait unremarkable.  Right ankle: There is a scar over the medial malleolus, he is extremely tender to light touch, seems to be some allodynia.  Impression and Recommendations:

## 2012-12-18 LAB — COMPREHENSIVE METABOLIC PANEL WITH GFR
ALT: 76 U/L — ABNORMAL HIGH (ref 0–53)
AST: 41 U/L — ABNORMAL HIGH (ref 0–37)
BUN: 25 mg/dL — ABNORMAL HIGH (ref 6–23)
CO2: 23 meq/L (ref 19–32)
Creat: 1.24 mg/dL (ref 0.50–1.35)
Glucose, Bld: 95 mg/dL (ref 70–99)
Potassium: 4.1 meq/L (ref 3.5–5.3)
Total Bilirubin: 0.7 mg/dL (ref 0.3–1.2)

## 2012-12-18 LAB — LIPID PANEL
Cholesterol: 220 mg/dL — ABNORMAL HIGH (ref 0–200)
HDL: 30 mg/dL — ABNORMAL LOW (ref >39)
Total CHOL/HDL Ratio: 7.3 Ratio
Triglycerides: 763 mg/dL — ABNORMAL HIGH (ref <150)

## 2012-12-18 LAB — CBC
HCT: 46.6 % (ref 39.0–52.0)
Hemoglobin: 17 g/dL (ref 13.0–17.0)
MCH: 29.3 pg (ref 26.0–34.0)
MCHC: 36.5 g/dL — ABNORMAL HIGH (ref 30.0–36.0)
MCV: 80.3 fL (ref 78.0–100.0)
Platelets: 273 K/uL (ref 150–400)
RBC: 5.8 MIL/uL (ref 4.22–5.81)
RDW: 14 % (ref 11.5–15.5)
WBC: 5.4 10*3/uL (ref 4.0–10.5)

## 2012-12-18 LAB — COMPREHENSIVE METABOLIC PANEL
Albumin: 5.2 g/dL (ref 3.5–5.2)
Alkaline Phosphatase: 103 U/L (ref 39–117)
Calcium: 10.5 mg/dL (ref 8.4–10.5)
Chloride: 98 mEq/L (ref 96–112)
Sodium: 136 mEq/L (ref 135–145)
Total Protein: 7.8 g/dL (ref 6.0–8.3)

## 2012-12-18 LAB — TSH: TSH: 28.445 u[IU]/mL — ABNORMAL HIGH (ref 0.350–4.500)

## 2012-12-19 LAB — HEMOGLOBIN A1C
Hgb A1c MFr Bld: 5.6 % (ref <5.7)
Mean Plasma Glucose: 114 mg/dL (ref <117)

## 2012-12-19 LAB — VITAMIN D 25 HYDROXY (VIT D DEFICIENCY, FRACTURES): Vit D, 25-Hydroxy: 30 ng/mL (ref 30–89)

## 2012-12-22 DIAGNOSIS — E039 Hypothyroidism, unspecified: Secondary | ICD-10-CM | POA: Insufficient documentation

## 2012-12-22 LAB — TESTOSTERONE, FREE, TOTAL, SHBG
Sex Hormone Binding: 17 nmol/L (ref 13–71)
Testosterone, Free: 87.4 pg/mL (ref 47.0–244.0)
Testosterone-% Free: 2.7 % (ref 1.6–2.9)
Testosterone: 319 ng/dL (ref 300–890)

## 2012-12-22 NOTE — Assessment & Plan Note (Signed)
TSH is extremely elevated. Adding T3 and T4 levels. Patient needs to restart his Synthroid.

## 2012-12-22 NOTE — Addendum Note (Signed)
Addended by: Monica Becton on: 12/22/2012 05:45 PM   Modules accepted: Orders

## 2012-12-25 LAB — T3, FREE: T3, Free: 2.4 pg/mL (ref 2.3–4.2)

## 2012-12-25 LAB — T4, FREE: Free T4: 0.76 ng/dL — ABNORMAL LOW (ref 0.80–1.80)

## 2012-12-25 MED ORDER — ICOSAPENT ETHYL 1 G PO CAPS: 1.0000 | ORAL_CAPSULE | Freq: Two times a day (BID) | ORAL | Status: DC

## 2012-12-25 NOTE — Addendum Note (Signed)
Addended by: Monica Becton on: 12/25/2012 10:14 AM   Modules accepted: Orders, Medications

## 2012-12-29 ENCOUNTER — Ambulatory Visit: Payer: 59 | Admitting: Sports Medicine

## 2013-01-05 ENCOUNTER — Ambulatory Visit (INDEPENDENT_AMBULATORY_CARE_PROVIDER_SITE_OTHER): Payer: 59 | Admitting: Sports Medicine

## 2013-01-05 ENCOUNTER — Encounter: Payer: Self-pay | Admitting: Sports Medicine

## 2013-01-05 VITALS — BP 111/72 | HR 65 | Wt 195.0 lb

## 2013-01-05 DIAGNOSIS — I10 Essential (primary) hypertension: Secondary | ICD-10-CM

## 2013-01-05 DIAGNOSIS — M5136 Other intervertebral disc degeneration, lumbar region with discogenic back pain only: Secondary | ICD-10-CM

## 2013-01-05 DIAGNOSIS — M67441 Ganglion, right hand: Secondary | ICD-10-CM

## 2013-01-05 DIAGNOSIS — M6740 Ganglion, unspecified site: Secondary | ICD-10-CM

## 2013-01-05 DIAGNOSIS — G8929 Other chronic pain: Secondary | ICD-10-CM

## 2013-01-05 DIAGNOSIS — E039 Hypothyroidism, unspecified: Secondary | ICD-10-CM

## 2013-01-05 DIAGNOSIS — E785 Hyperlipidemia, unspecified: Secondary | ICD-10-CM

## 2013-01-05 DIAGNOSIS — M545 Low back pain, unspecified: Secondary | ICD-10-CM

## 2013-01-05 DIAGNOSIS — M25579 Pain in unspecified ankle and joints of unspecified foot: Secondary | ICD-10-CM

## 2013-01-05 NOTE — Assessment & Plan Note (Signed)
Extremely well controlled, no changes. 

## 2013-01-05 NOTE — Assessment & Plan Note (Addendum)
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 Looking for an interventional target.  Return for custom orthotics.

## 2013-01-05 NOTE — Assessment & Plan Note (Signed)
Subclinical hypothyroidism, no acute treatment needed.

## 2013-01-05 NOTE — Assessment & Plan Note (Signed)
Post op

## 2013-01-05 NOTE — Assessment & Plan Note (Signed)
Awaiting x-rays, we will eventually likely need an MRI for interventional injection planning. He has plenty of pain medication left over.

## 2013-01-05 NOTE — Progress Notes (Signed)
  Subjective:    CC: Followup  HPI: Hypothyroidism: Subclinical, asymptomatic.  Hypertriglyceridemia: No adverse effects on fish oil, unable to afford fenofibrate.  Chronic low back pain: Has not yet gotten his x-rays, still has plenty of narcotics left.  Right ankle pain: Status post ORIF, has not yet gotten his x-rays.  Past medical history, Surgical history, Family history not pertinant except as noted below, Social history, Allergies, and medications have been entered into the medical record, reviewed, and no changes needed.   Review of Systems: No fevers, chills, night sweats, weight loss, chest pain, or shortness of breath.   Objective:    General: Well Developed, well nourished, and in no acute distress.  Neuro: Alert and oriented x3, extra-ocular muscles intact, sensation grossly intact.  HEENT: Normocephalic, atraumatic, pupils equal round reactive to light, neck supple, no masses, no lymphadenopathy, thyroid nonpalpable.  Skin: Warm and dry, no rashes. Cardiac: Regular rate and rhythm, no murmurs rubs or gallops, no lower extremity edema.  Respiratory: Clear to auscultation bilaterally. Not using accessory muscles, speaking in full sentences.  Impression and Recommendations:

## 2013-01-05 NOTE — Assessment & Plan Note (Signed)
Continue fish oil. Recheck lipids in about 3 weeks.

## 2013-01-14 ENCOUNTER — Ambulatory Visit (HOSPITAL_BASED_OUTPATIENT_CLINIC_OR_DEPARTMENT_OTHER)
Admission: RE | Admit: 2013-01-14 | Discharge: 2013-01-14 | Disposition: A | Discharge status: Home / Self Care | Payer: 59 | Source: Ambulatory Visit | Attending: Sports Medicine | Admitting: Sports Medicine

## 2013-01-14 DIAGNOSIS — M25571 Pain in right ankle and joints of right foot: Secondary | ICD-10-CM

## 2013-01-14 DIAGNOSIS — Q7649 Other congenital malformations of spine, not associated with scoliosis: Secondary | ICD-10-CM | POA: Insufficient documentation

## 2013-01-14 DIAGNOSIS — M5136 Other intervertebral disc degeneration, lumbar region: Secondary | ICD-10-CM

## 2013-01-14 DIAGNOSIS — M545 Low back pain, unspecified: Secondary | ICD-10-CM | POA: Insufficient documentation

## 2013-01-14 DIAGNOSIS — M79609 Pain in unspecified limb: Secondary | ICD-10-CM | POA: Insufficient documentation

## 2013-01-15 ENCOUNTER — Encounter: Payer: Self-pay | Admitting: Sports Medicine

## 2013-01-15 ENCOUNTER — Ambulatory Visit (INDEPENDENT_AMBULATORY_CARE_PROVIDER_SITE_OTHER): Payer: 59 | Admitting: Sports Medicine

## 2013-01-15 VITALS — BP 119/69 | HR 78 | Wt 195.0 lb

## 2013-01-15 DIAGNOSIS — G8929 Other chronic pain: Secondary | ICD-10-CM

## 2013-01-15 DIAGNOSIS — M25579 Pain in unspecified ankle and joints of unspecified foot: Secondary | ICD-10-CM

## 2013-01-15 DIAGNOSIS — M25571 Pain in right ankle and joints of right foot: Secondary | ICD-10-CM

## 2013-01-15 NOTE — Progress Notes (Signed)
    Patient was fitted for a : standard, cushioned, semi-rigid orthotic. The orthotic was heated and afterward the patient stood on the orthotic blank positioned on the orthotic stand. The patient was positioned in subtalar neutral position and 10 degrees of ankle dorsiflexion in a weight bearing stance. After completion of molding, a stable base was applied to the orthotic blank. The blank was ground to a stable position for weight bearing. Size: 12 Base: Blue EVA Additional Posting and Padding: None The patient ambulated these, and they were very comfortable.  I spent 40 minutes with this patient, greater than 50% was face-to-face time counseling regarding the below diagnosis.   

## 2013-01-15 NOTE — Assessment & Plan Note (Signed)
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 Occasional hydrocodone as well as Mobic works well for now. He is still resistant to interventional treatment. Should we proceed with interventional treatment, I would give him a Valium before the injection. Custom orthotics were created as above.

## 2013-01-26 ENCOUNTER — Telehealth: Payer: Self-pay | Admitting: Family Medicine

## 2013-01-26 NOTE — Telephone Encounter (Signed)
Pt called and was wondering IF you are able to email him his images of his ankle to his email [email or web address]

## 2013-01-26 NOTE — Telephone Encounter (Signed)
I can't attach it to the email but he should have radiology burn a CD with all images on it.

## 2013-01-27 NOTE — Telephone Encounter (Signed)
I called patient to let him know he could have his images put on a disc. Thanks

## 2013-03-02 ENCOUNTER — Ambulatory Visit (INDEPENDENT_AMBULATORY_CARE_PROVIDER_SITE_OTHER): Payer: 59 | Admitting: Sports Medicine

## 2013-03-02 ENCOUNTER — Encounter: Payer: Self-pay | Admitting: Sports Medicine

## 2013-03-02 VITALS — BP 116/76 | HR 73 | Wt 199.0 lb

## 2013-03-02 DIAGNOSIS — M25579 Pain in unspecified ankle and joints of unspecified foot: Secondary | ICD-10-CM

## 2013-03-02 DIAGNOSIS — I10 Essential (primary) hypertension: Secondary | ICD-10-CM

## 2013-03-02 DIAGNOSIS — E039 Hypothyroidism, unspecified: Secondary | ICD-10-CM

## 2013-03-02 DIAGNOSIS — E785 Hyperlipidemia, unspecified: Secondary | ICD-10-CM

## 2013-03-02 DIAGNOSIS — M545 Low back pain, unspecified: Secondary | ICD-10-CM

## 2013-03-02 DIAGNOSIS — M5136 Other intervertebral disc degeneration, lumbar region: Secondary | ICD-10-CM

## 2013-03-02 DIAGNOSIS — G8929 Other chronic pain: Secondary | ICD-10-CM

## 2013-03-02 MED ORDER — HYDROCODONE-ACETAMINOPHEN 5-325 MG PO TABS: 1.0000 | ORAL_TABLET | Freq: Four times a day (QID) | ORAL | Status: DC | PRN

## 2013-03-02 NOTE — Assessment & Plan Note (Signed)
Resolved with home exercises.

## 2013-03-02 NOTE — Assessment & Plan Note (Signed)
Stable and improved with orthotics and occasional hydrocodone.

## 2013-03-02 NOTE — Assessment & Plan Note (Signed)
Continue fish oil, he does not want to restart the fenofibrate. We will recheck levels in about a month.

## 2013-03-02 NOTE — Assessment & Plan Note (Signed)
Well controlled, no changes 

## 2013-03-02 NOTE — Progress Notes (Signed)
  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  Hypothyroidism: Subclinical, TSH has been in the 20s, T3 and T4 have been normal or slightly low in the past. He has declined treatment in the past.  Hypertriglyceridemia: Improved significantly with fish oil, takes this intermittently, and only takes fenofibrate intermittently, mostly due to cost.  Hypertension: Well controlled.  Past medical history, Surgical history, Family history not pertinant except as noted below, Social history, Allergies, and medications have been entered into the medical record, reviewed, and no changes needed.   Review of Systems: No fevers, chills, night sweats, weight loss, chest pain, or shortness of breath.   Objective:    General: Well Developed, well nourished, and in no acute distress.  Neuro: Alert and oriented x3, extra-ocular muscles intact, sensation grossly intact.  HEENT: Normocephalic, atraumatic, pupils equal round reactive to light, neck supple, no masses, no lymphadenopathy, thyroid nonpalpable.  Skin: Warm and dry, no rashes. Cardiac: Regular rate and rhythm, no murmurs rubs or gallops, no lower extremity edema.  Respiratory: Clear to auscultation bilaterally. Not using accessory muscles, speaking in full sentences.  Impression and Recommendations:

## 2013-03-02 NOTE — Assessment & Plan Note (Signed)
Continues to be resistant to taking levothyroxine. He does have predominant subclinical hypothyroidism with a low T4. We did discuss the risks. He will take his levothyroxine, we will recheck levels in one month.

## 2013-04-30 ENCOUNTER — Ambulatory Visit: Payer: 59 | Admitting: Sports Medicine

## 2013-06-23 ENCOUNTER — Ambulatory Visit (INDEPENDENT_AMBULATORY_CARE_PROVIDER_SITE_OTHER): Payer: 59 | Admitting: Sports Medicine

## 2013-06-23 ENCOUNTER — Encounter: Payer: Self-pay | Admitting: Sports Medicine

## 2013-06-23 VITALS — BP 127/88 | HR 92 | Ht 69.0 in | Wt 200.0 lb

## 2013-06-23 DIAGNOSIS — M5136 Other intervertebral disc degeneration, lumbar region with discogenic back pain only: Secondary | ICD-10-CM

## 2013-06-23 DIAGNOSIS — M545 Low back pain, unspecified: Secondary | ICD-10-CM

## 2013-06-23 DIAGNOSIS — A088 Other specified intestinal infections: Secondary | ICD-10-CM

## 2013-06-23 DIAGNOSIS — A084 Viral intestinal infection, unspecified: Secondary | ICD-10-CM | POA: Insufficient documentation

## 2013-06-23 DIAGNOSIS — E785 Hyperlipidemia, unspecified: Secondary | ICD-10-CM

## 2013-06-23 MED ORDER — DIPHENOXYLATE-ATROPINE 2.5-0.025 MG PO TABS: ORAL_TABLET | ORAL | Status: DC

## 2013-06-23 MED ORDER — PROMETHAZINE HCL 25 MG PO TABS: 25.0000 mg | ORAL_TABLET | Freq: Four times a day (QID) | ORAL | Status: DC | PRN

## 2013-06-23 MED ORDER — PRAVASTATIN SODIUM 40 MG PO TABS: 40.0000 mg | ORAL_TABLET | Freq: Every day | ORAL | Status: DC

## 2013-06-23 MED ORDER — HYDROCODONE-ACETAMINOPHEN 5-325 MG PO TABS: 1.0000 | ORAL_TABLET | Freq: Four times a day (QID) | ORAL | Status: DC | PRN

## 2013-06-23 NOTE — Assessment & Plan Note (Signed)
Phenergan, Lomotil. Return if no better in one to 2 weeks.

## 2013-06-23 NOTE — Progress Notes (Signed)
  Subjective:    CC: Sick  HPI: Drew Cabrera comes in with a 3 day history of mild abdominal pain but predominately nausea and vomiting without hematemesis. He denies any diarrhea, his wife has similar symptoms. They're moderate, persistent.  Back pain: Needs refill of hydrocodone.  Ankle pain: Has been resistant to any form of interventional treatment, desires refill of hydrocodone, last refill was 4 months ago for 30 tablets.  Overweight: Desires to start weight loss treatment, he understands that we are going to discuss this at a future visit.  Past medical history, Surgical history, Family history not pertinant except as noted below, Social history, Allergies, and medications have been entered into the medical record, reviewed, and no changes needed.   Review of Systems: No fevers, chills, night sweats, weight loss, chest pain, or shortness of breath.   Objective:    General: Well Developed, well nourished, and in no acute distress.  Neuro: Alert and oriented x3, extra-ocular muscles intact, sensation grossly intact.  HEENT: Normocephalic, atraumatic, pupils equal round reactive to light, neck supple, no masses, no lymphadenopathy, thyroid nonpalpable.  Skin: Warm and dry, no rashes. Cardiac: Regular rate and rhythm, no murmurs rubs or gallops, no lower extremity edema.  Respiratory: Clear to auscultation bilaterally. Not using accessory muscles, speaking in full sentences. Abdominal: Soft, no palpable masses, minimally tender to palpation in the epigastrium, normal bowel sounds.  Impression and Recommendations:

## 2013-06-23 NOTE — Assessment & Plan Note (Signed)
Okay with starting pravastatin. We will do this for 2 months and then recheck lipids.

## 2013-06-23 NOTE — Patient Instructions (Signed)
Viral Gastroenteritis Viral gastroenteritis is also known as stomach flu. This condition affects the stomach and intestinal tract. It can cause sudden diarrhea and vomiting. The illness typically lasts 3 to 8 days. Most people develop an immune response that eventually gets rid of the virus. While this natural response develops, the virus can make you quite ill. CAUSES  Many different viruses can cause gastroenteritis, such as rotavirus or noroviruses. You can catch one of these viruses by consuming contaminated food or water. You may also catch a virus by sharing utensils or other personal items with an infected person or by touching a contaminated surface. SYMPTOMS  The most common symptoms are diarrhea and vomiting. These problems can cause a severe loss of body fluids (dehydration) and a body salt (electrolyte) imbalance. Other symptoms may include:  Fever.  Headache.  Fatigue.  Abdominal pain. DIAGNOSIS  Your caregiver can usually diagnose viral gastroenteritis based on your symptoms and a physical exam. A stool sample may also be taken to test for the presence of viruses or other infections. TREATMENT  This illness typically goes away on its own. Treatments are aimed at rehydration. The most serious cases of viral gastroenteritis involve vomiting so severely that you are not able to keep fluids down. In these cases, fluids must be given through an intravenous line (IV). HOME CARE INSTRUCTIONS   Drink enough fluids to keep your urine clear or pale yellow. Drink small amounts of fluids frequently and increase the amounts as tolerated.  Ask your caregiver for specific rehydration instructions.  Avoid:  Foods high in sugar.  Alcohol.  Carbonated drinks.  Tobacco.  Juice.  Caffeine drinks.  Extremely hot or cold fluids.  Fatty, greasy foods.  Too much intake of anything at one time.  Dairy products until 24 to 48 hours after diarrhea stops.  You may consume probiotics.  Probiotics are active cultures of beneficial bacteria. They may lessen the amount and number of diarrheal stools in adults. Probiotics can be found in yogurt with active cultures and in supplements.  Wash your hands well to avoid spreading the virus.  Only take over-the-counter or prescription medicines for pain, discomfort, or fever as directed by your caregiver. Do not give aspirin to children. Antidiarrheal medicines are not recommended.  Ask your caregiver if you should continue to take your regular prescribed and over-the-counter medicines.  Keep all follow-up appointments as directed by your caregiver. SEEK IMMEDIATE MEDICAL CARE IF:   You are unable to keep fluids down.  You do not urinate at least once every 6 to 8 hours.  You develop shortness of breath.  You notice blood in your stool or vomit. This may look like coffee grounds.  You have abdominal pain that increases or is concentrated in one small area (localized).  You have persistent vomiting or diarrhea.  You have a fever.  The patient is a child younger than 3 months, and he or she has a fever.  The patient is a child older than 3 months, and he or she has a fever and persistent symptoms.  The patient is a child older than 3 months, and he or she has a fever and symptoms suddenly get worse.  The patient is a baby, and he or she has no tears when crying. MAKE SURE YOU:   Understand these instructions.  Will watch your condition.  Will get help right away if you are not doing well or get worse. Document Released: 06/04/2005 Document Revised: 08/27/2011 Document Reviewed: 03/21/2011   [employer] Patient Information 2014 [employer]  

## 2013-06-24 MED ORDER — PROMETHAZINE HCL 25 MG/ML IJ SOLN
25.0000 mg | Freq: Once | INTRAMUSCULAR | Status: AC
  Administered 2013-06-24: 25 mg via INTRAMUSCULAR

## 2013-06-24 NOTE — Addendum Note (Signed)
Addended by: Pixie CasinoUNNINGHAM, RHONDA C on: 06/24/2013 08:51 AM   Modules accepted: Orders

## 2013-07-07 ENCOUNTER — Other Ambulatory Visit: Payer: Self-pay

## 2013-07-07 DIAGNOSIS — E785 Hyperlipidemia, unspecified: Secondary | ICD-10-CM

## 2013-07-07 MED ORDER — PRAVASTATIN SODIUM 40 MG PO TABS: 40.0000 mg | ORAL_TABLET | Freq: Every day | ORAL | Status: DC

## 2013-07-07 NOTE — Telephone Encounter (Signed)
[facility] [address] so I resent it in. Farren Landa,CMA

## 2013-08-19 LAB — LIPID PANEL
Cholesterol: 129 mg/dL (ref 0–200)
HDL: 30 mg/dL — ABNORMAL LOW (ref >39)
LDL Cholesterol: 25 mg/dL (ref 0–99)
Total CHOL/HDL Ratio: 4.3 Ratio
Triglycerides: 369 mg/dL — ABNORMAL HIGH (ref <150)
VLDL: 74 mg/dL — ABNORMAL HIGH (ref 0–40)

## 2013-08-19 LAB — COMPREHENSIVE METABOLIC PANEL WITH GFR
ALT: 54 U/L — ABNORMAL HIGH (ref 0–53)
Albumin: 4.8 g/dL (ref 3.5–5.2)
Alkaline Phosphatase: 90 U/L (ref 39–117)
CO2: 25 meq/L (ref 19–32)
Glucose, Bld: 110 mg/dL — ABNORMAL HIGH (ref 70–99)
Potassium: 4.1 meq/L (ref 3.5–5.3)
Sodium: 137 meq/L (ref 135–145)
Total Protein: 7.4 g/dL (ref 6.0–8.3)

## 2013-08-19 LAB — CBC
HCT: 43.8 % (ref 39.0–52.0)
Hemoglobin: 15.5 g/dL (ref 13.0–17.0)
MCH: 29.6 pg (ref 26.0–34.0)
MCHC: 35.4 g/dL (ref 30.0–36.0)
MCV: 83.7 fL (ref 78.0–100.0)
Platelets: 231 K/uL (ref 150–400)
RBC: 5.23 MIL/uL (ref 4.22–5.81)
RDW: 14.1 % (ref 11.5–15.5)
WBC: 5.1 10*3/uL (ref 4.0–10.5)

## 2013-08-19 LAB — COMPREHENSIVE METABOLIC PANEL
AST: 34 U/L (ref 0–37)
BUN: 22 mg/dL (ref 6–23)
Calcium: 10 mg/dL (ref 8.4–10.5)
Chloride: 102 mEq/L (ref 96–112)
Creat: 1.19 mg/dL (ref 0.50–1.35)
Total Bilirubin: 0.5 mg/dL (ref 0.2–1.2)

## 2013-08-21 ENCOUNTER — Encounter: Payer: Self-pay | Admitting: Sports Medicine

## 2013-08-21 ENCOUNTER — Ambulatory Visit (INDEPENDENT_AMBULATORY_CARE_PROVIDER_SITE_OTHER): Payer: 59 | Admitting: Sports Medicine

## 2013-08-21 VITALS — BP 117/72 | HR 70 | Ht 69.0 in | Wt 205.0 lb

## 2013-08-21 DIAGNOSIS — E669 Obesity, unspecified: Secondary | ICD-10-CM | POA: Insufficient documentation

## 2013-08-21 DIAGNOSIS — M545 Low back pain, unspecified: Secondary | ICD-10-CM

## 2013-08-21 DIAGNOSIS — E785 Hyperlipidemia, unspecified: Secondary | ICD-10-CM

## 2013-08-21 DIAGNOSIS — M5136 Other intervertebral disc degeneration, lumbar region: Secondary | ICD-10-CM

## 2013-08-21 DIAGNOSIS — I10 Essential (primary) hypertension: Secondary | ICD-10-CM

## 2013-08-21 MED ORDER — HYDROCODONE-ACETAMINOPHEN 5-325 MG PO TABS: 1.0000 | ORAL_TABLET | Freq: Four times a day (QID) | ORAL | Status: DC | PRN

## 2013-08-21 MED ORDER — PHENTERMINE HCL 37.5 MG PO CAPS: 37.5000 mg | ORAL_CAPSULE | ORAL | Status: DC

## 2013-08-21 NOTE — Assessment & Plan Note (Signed)
Drew MuirJamie now takes his blood pressure medicine regularly, his blood pressure is now beautifully controlled.

## 2013-08-21 NOTE — Progress Notes (Signed)
  Subjective:    CC: Follow up  HPI: Hyperlipidemia: Improved significantly since starting pravastatin, triglycerides are still elevated but everything is the best it's been.  Hypertension: Finally taking his medicine as prescribed, blood pressure is perfect.  Obesity: Desires to start weight loss treatment.  Past medical history, Surgical history, Family history not pertinant except as noted below, Social history, Allergies, and medications have been entered into the medical record, reviewed, and no changes needed.   Review of Systems: No fevers, chills, night sweats, weight loss, chest pain, or shortness of breath.   Objective:    General: Well Developed, well nourished, and in no acute distress.  Neuro: Alert and oriented x3, extra-ocular muscles intact, sensation grossly intact.  HEENT: Normocephalic, atraumatic, pupils equal round reactive to light, neck supple, no masses, no lymphadenopathy, thyroid nonpalpable.  Skin: Warm and dry, no rashes. Cardiac: Regular rate and rhythm, no murmurs rubs or gallops, no lower extremity edema.  Respiratory: Clear to auscultation bilaterally. Not using accessory muscles, speaking in full sentences.  Impression and Recommendations:

## 2013-08-21 NOTE — Assessment & Plan Note (Signed)
Excellent improvement in lipids with pravastatin. Triglycerides are still elevated, we are going to try some weight loss first and then recheck lipids before considering an additional triglyceride lowering medication.

## 2013-08-21 NOTE — Assessment & Plan Note (Signed)
Starting phentermine, return monthly for weight checks and refills. 

## 2013-08-24 ENCOUNTER — Other Ambulatory Visit: Payer: Self-pay | Admitting: Sports Medicine

## 2013-08-24 MED ORDER — MELOXICAM 15 MG PO TABS: 15.0000 mg | ORAL_TABLET | Freq: Every day | ORAL | Status: DC

## 2013-09-22 ENCOUNTER — Encounter: Payer: Self-pay | Admitting: Sports Medicine

## 2013-09-22 ENCOUNTER — Ambulatory Visit (INDEPENDENT_AMBULATORY_CARE_PROVIDER_SITE_OTHER): Payer: 59 | Admitting: Sports Medicine

## 2013-09-22 VITALS — BP 117/73 | HR 69 | Ht 69.0 in | Wt 193.0 lb

## 2013-09-22 DIAGNOSIS — E669 Obesity, unspecified: Secondary | ICD-10-CM

## 2013-09-22 DIAGNOSIS — E785 Hyperlipidemia, unspecified: Secondary | ICD-10-CM

## 2013-09-22 DIAGNOSIS — I10 Essential (primary) hypertension: Secondary | ICD-10-CM

## 2013-09-22 MED ORDER — PHENTERMINE HCL 37.5 MG PO CAPS: 37.5000 mg | ORAL_CAPSULE | ORAL | Status: DC

## 2013-09-22 MED ORDER — LISINOPRIL-HYDROCHLOROTHIAZIDE 20-25 MG PO TABS: 1.0000 | ORAL_TABLET | Freq: Every day | ORAL | Status: DC

## 2013-09-22 NOTE — Assessment & Plan Note (Signed)
Refilling phentermine, 12 pound weight loss since the last month.

## 2013-09-22 NOTE — Progress Notes (Signed)
  Subjective:    CC: Followup  HPI: Hypertension: Well controlled.  Hyperlipidemia: Will control.  Obesity: 12 pound weight loss since the last visit.  Past medical history, Surgical history, Family history not pertinant except as noted below, Social history, Allergies, and medications have been entered into the medical record, reviewed, and no changes needed.   Review of Systems: No fevers, chills, night sweats, weight loss, chest pain, or shortness of breath.   Objective:    General: Well Developed, well nourished, and in no acute distress.  Neuro: Alert and oriented x3, extra-ocular muscles intact, sensation grossly intact.  HEENT: Normocephalic, atraumatic, pupils equal round reactive to light, neck supple, no masses, no lymphadenopathy, thyroid nonpalpable.  Skin: Warm and dry, no rashes. Cardiac: Regular rate and rhythm, no murmurs rubs or gallops, no lower extremity edema.  Respiratory: Clear to auscultation bilaterally. Not using accessory muscles, speaking in full sentences.  Impression and Recommendations:

## 2013-09-22 NOTE — Assessment & Plan Note (Signed)
Stable on Pravachol

## 2013-09-22 NOTE — Assessment & Plan Note (Signed)
Well-controlled, refilling blood pressure medication. 

## 2013-09-25 ENCOUNTER — Ambulatory Visit: Payer: 59 | Admitting: Sports Medicine

## 2013-10-22 ENCOUNTER — Encounter: Payer: Self-pay | Admitting: Sports Medicine

## 2013-10-22 ENCOUNTER — Ambulatory Visit (INDEPENDENT_AMBULATORY_CARE_PROVIDER_SITE_OTHER): Payer: 59 | Admitting: Sports Medicine

## 2013-10-22 VITALS — BP 108/68 | HR 70 | Ht 69.0 in | Wt 184.0 lb

## 2013-10-22 DIAGNOSIS — M6740 Ganglion, unspecified site: Secondary | ICD-10-CM

## 2013-10-22 DIAGNOSIS — M545 Low back pain, unspecified: Secondary | ICD-10-CM

## 2013-10-22 DIAGNOSIS — M5136 Other intervertebral disc degeneration, lumbar region with discogenic back pain only: Secondary | ICD-10-CM

## 2013-10-22 DIAGNOSIS — E669 Obesity, unspecified: Secondary | ICD-10-CM

## 2013-10-22 DIAGNOSIS — M67441 Ganglion, right hand: Secondary | ICD-10-CM

## 2013-10-22 DIAGNOSIS — IMO0001 Reserved for inherently not codable concepts without codable children: Secondary | ICD-10-CM

## 2013-10-22 MED ORDER — PHENTERMINE HCL 37.5 MG PO CAPS: 37.5000 mg | ORAL_CAPSULE | ORAL | Status: DC

## 2013-10-22 MED ORDER — CYCLOBENZAPRINE HCL 10 MG PO TABS: ORAL_TABLET | ORAL | Status: DC

## 2013-10-22 NOTE — Progress Notes (Signed)
  Subjective:    CC: Followup  HPI: Obesity: Additional 11 pounds weight loss on medication. This brings her total 23 pounds loss in the last 2 months. No side effects.  Back pain: History of degenerative disc disease, now with 3 trigger points along the left and right parathoracic and paralumbar muscles. Moderate, persistent, does wake him at night occasionally. Present for 2 weeks now.  Past medical history, Surgical history, Family history not pertinant except as noted below, Social history, Allergies, and medications have been entered into the medical record, reviewed, and no changes needed.   Review of Systems: No fevers, chills, night sweats, weight loss, chest pain, or shortness of breath.   Objective:    General: Well Developed, well nourished, and in no acute distress.  Neuro: Alert and oriented x3, extra-ocular muscles intact, sensation grossly intact.  HEENT: Normocephalic, atraumatic, pupils equal round reactive to light, neck supple, no masses, no lymphadenopathy, thyroid nonpalpable.  Skin: Warm and dry, no rashes. Cardiac: Regular rate and rhythm, no murmurs rubs or gallops, no lower extremity edema.  Respiratory: Clear to auscultation bilaterally. Not using accessory muscles, speaking in full sentences. Back Exam:  Inspection: Unremarkable  Motion: Flexion 45 deg, Extension 45 deg, Side Bending to 45 deg bilaterally,  Rotation to 45 deg bilaterally  SLR laying: Negative  XSLR laying: Negative  Palpable tenderness: There is a single left-sided parathoracic and 2 right-sided paralumbar trigger points. FABER: negative. Sensory change: Gross sensation intact to all lumbar and sacral dermatomes.  Reflexes: 2+ at both patellar tendons, 2+ at achilles tendons, Babinski's downgoing.  Strength at foot  Plantar-flexion: 5/5 Dorsi-flexion: 5/5 Eversion: 5/5 Inversion: 5/5  Leg strength  Quad: 5/5 Hamstring: 5/5 Hip flexor: 5/5 Hip abductors: 5/5  Gait  unremarkable.  Procedure:  Injection of a single left parathoracic and 2 right paralumbar trigger points, for a total of 3 injections. Consent obtained and verified. Time-out conducted. Noted no overlying erythema, induration, or other signs of local infection. Skin prepped in a sterile fashion. Topical analgesic spray: Ethyl chloride. Completed without difficulty. Meds: 1 cc Kenalog 40, 2 cc lidocaine were spread out between the 3 trigger points. Pain immediately improved suggesting accurate placement of the medication. Advised to call if fevers/chills, erythema, induration, drainage, or persistent bleeding.  Impression and Recommendations:

## 2013-10-22 NOTE — Assessment & Plan Note (Signed)
Left-sided parathoracic muscle spasm. Flexeril at bedtime, trigger point injection as above. Return as needed for this.

## 2013-10-22 NOTE — Assessment & Plan Note (Signed)
Continued excellent weight loss. Refilling phentermine. Return in one month for a weight check and refills.

## 2013-11-23 ENCOUNTER — Ambulatory Visit (INDEPENDENT_AMBULATORY_CARE_PROVIDER_SITE_OTHER): Payer: 59 | Admitting: Sports Medicine

## 2013-11-23 ENCOUNTER — Encounter: Payer: Self-pay | Admitting: Sports Medicine

## 2013-11-23 VITALS — BP 104/66 | HR 79 | Ht 69.0 in | Wt 175.0 lb

## 2013-11-23 DIAGNOSIS — E785 Hyperlipidemia, unspecified: Secondary | ICD-10-CM

## 2013-11-23 DIAGNOSIS — M5136 Other intervertebral disc degeneration, lumbar region with discogenic back pain only: Secondary | ICD-10-CM

## 2013-11-23 DIAGNOSIS — E669 Obesity, unspecified: Secondary | ICD-10-CM

## 2013-11-23 DIAGNOSIS — M545 Low back pain, unspecified: Secondary | ICD-10-CM

## 2013-11-23 DIAGNOSIS — I10 Essential (primary) hypertension: Secondary | ICD-10-CM

## 2013-11-23 MED ORDER — PHENTERMINE HCL 37.5 MG PO CAPS: 37.5000 mg | ORAL_CAPSULE | ORAL | Status: DC

## 2013-11-23 MED ORDER — LISINOPRIL-HYDROCHLOROTHIAZIDE 20-25 MG PO TABS: 0.5000 | ORAL_TABLET | Freq: Every day | ORAL | Status: DC

## 2013-11-23 NOTE — Assessment & Plan Note (Signed)
No response to trigger point injections. He did see a chiropractor, symptoms have improved significantly. Continue chiropractic care, return as needed.

## 2013-11-23 NOTE — Progress Notes (Signed)
    Subjective:    CC: Followup  HPI: Obesity: Continued excellent weight loss.  Hypertension: Lately he's been having some orthostasis, currently on max doses of lisinopril/hydrochlorothiazide.   Hyper lipidemia: Predominantly hypertriglyceridemia now, last check was over 3 months ago, stable on current medications but due for a recheck.  Past medical history, Surgical history, Family history not pertinant except as noted below, Social history, Allergies, and medications have been entered into the medical record, reviewed, and no changes needed.   Review of Systems: No fevers, chills, night sweats, weight loss, chest pain, or shortness of breath.   Objective:    General: Well Developed, well nourished, and in no acute distress.  Neuro: Alert and oriented x3, extra-ocular muscles intact, sensation grossly intact.  HEENT: Normocephalic, atraumatic, pupils equal round reactive to light, neck supple, no masses, no lymphadenopathy, thyroid nonpalpable.  Skin: Warm and dry, no rashes. Cardiac: Regular rate and rhythm, no murmurs rubs or gallops, no lower extremity edema. Respiratory: Clear to auscultation bilaterally. Not using accessory muscles, speaking in full sentences.  Impression and Recommendations:

## 2013-11-23 NOTE — Assessment & Plan Note (Signed)
30 pound weight loss so far after 3 months. Refilling phentermine, return in a month.

## 2013-11-23 NOTE — Assessment & Plan Note (Signed)
Did have some orthostatic symptoms recently. Currently lisinopril/hydrochlorothiazide at max dose. He is now lost 30 pounds so I do suspect he won't need as much blood pressure medication. Decreased to one half tab daily rather than a full tab daily. Return in a month.

## 2013-11-23 NOTE — Assessment & Plan Note (Signed)
Predominately with hypertriglyceridemia, last check was 3 months ago. Rechecking triglycerides. Patient will come back fasting.

## 2013-12-15 LAB — LIPID PANEL
Cholesterol: 109 mg/dL (ref 0–200)
HDL: 36 mg/dL — ABNORMAL LOW (ref >39)
LDL Cholesterol: 38 mg/dL (ref 0–99)
Total CHOL/HDL Ratio: 3 ratio
Triglycerides: 173 mg/dL — ABNORMAL HIGH (ref <150)
VLDL: 35 mg/dL (ref 0–40)

## 2013-12-24 ENCOUNTER — Encounter: Payer: Self-pay | Admitting: Sports Medicine

## 2013-12-24 ENCOUNTER — Ambulatory Visit (INDEPENDENT_AMBULATORY_CARE_PROVIDER_SITE_OTHER): Payer: 59 | Admitting: Sports Medicine

## 2013-12-24 VITALS — BP 110/77 | HR 76 | Wt 174.0 lb

## 2013-12-24 DIAGNOSIS — K120 Recurrent oral aphthae: Secondary | ICD-10-CM

## 2013-12-24 DIAGNOSIS — E669 Obesity, unspecified: Secondary | ICD-10-CM

## 2013-12-24 DIAGNOSIS — M25571 Pain in right ankle and joints of right foot: Secondary | ICD-10-CM

## 2013-12-24 DIAGNOSIS — M25579 Pain in unspecified ankle and joints of unspecified foot: Secondary | ICD-10-CM

## 2013-12-24 DIAGNOSIS — G8929 Other chronic pain: Secondary | ICD-10-CM

## 2013-12-24 MED ORDER — PHENTERMINE HCL 37.5 MG PO CAPS: 37.5000 mg | ORAL_CAPSULE | ORAL | Status: DC

## 2013-12-24 MED ORDER — TRIAMCINOLONE ACETONIDE 0.1 % MT PSTE: 1.0000 "application " | PASTE | Freq: Two times a day (BID) | OROMUCOSAL | Status: DC

## 2013-12-24 NOTE — Assessment & Plan Note (Signed)
Triamcinolone paste

## 2013-12-24 NOTE — Addendum Note (Signed)
Addended by: Monica BectonHEKKEKANDAM, THOMAS J on: 12/24/2013 04:31 PM   Modules accepted: Orders

## 2013-12-24 NOTE — Assessment & Plan Note (Signed)
Good response to initial set of custom orthotics, repeat orthotics today.

## 2013-12-24 NOTE — Progress Notes (Signed)
    Patient was fitted for a : standard, cushioned, semi-rigid orthotic. The orthotic was heated and afterward the patient stood on the orthotic blank positioned on the orthotic stand. The patient was positioned in subtalar neutral position and 10 degrees of ankle dorsiflexion in a weight bearing stance. After completion of molding, a stable base was applied to the orthotic blank. The blank was ground to a stable position for weight bearing. Size: 14 Base: Blue EVA Additional Posting and Padding: None The patient ambulated these, and they were very comfortable.  I spent 40 minutes with this patient, greater than 50% was face-to-face time counseling regarding the below diagnosis.   

## 2013-12-24 NOTE — Patient Instructions (Signed)
Aphthous Ulcer/Canker Sores  Canker sores are painful, open sores on the inside of the mouth and cheek. They may be white or yellow. The sores usually heal in 1 to 2 weeks. Women are more likely than men to have recurrent canker sores. CAUSES The cause of canker sores is not well understood. More than one cause is likely. Canker sores do not appear to be caused by certain types of germs (viruses or bacteria). Canker sores may be caused by:  An allergic reaction to certain foods.  Digestive problems.  Not having enough vitamin B12, folic acid, and iron.  Male sex hormones. Sores may come only during certain phases of a menstrual cycle. Often, there is improvement during pregnancy.  Genetics. Some people seem to inherit canker sore problems. Emotional stress and injuries to the mouth may trigger outbreaks, but not cause them.  DIAGNOSIS Canker sores are diagnosed by exam.  TREATMENT  Patients who have frequent bouts of canker sores may have cultures taken of the sores, blood tests, or allergy tests. This helps determine if their sores are caused by a poor diet, an allergy, or some other preventable or treatable disease.  Vitamins may prevent recurrences or reduce the severity of canker sores in people with poor nutrition.  Numbing ointments can relieve pain. These are available in drug stores without a prescription.  Anti-inflammatory steroid mouth rinses or gels may be prescribed by your caregiver for severe sores.  Oral steroids may be prescribed if you have severe, recurrent canker sores. These strong medicines can cause many side effects and should be used only under the close direction of a dentist or physician.  Mouth rinses containing the antibiotic medicine may be prescribed. They may lessen symptoms and speed healing. Healing usually happens in about 1 or 2 weeks with or without treatment. Certain antibiotic mouth rinses given to pregnant women and young children can  permanently stain teeth. Talk to your caregiver about your treatment. HOME CARE INSTRUCTIONS   Avoid foods that cause canker sores for you.  Avoid citrus juices, spicy or salty foods, and coffee until the sores are healed.  Use a soft-bristled toothbrush.  Chew your food carefully to avoid biting your cheek.  Apply topical numbing medicine to the sore to help relieve pain.  Apply a thin paste of baking soda and water to the sore to help heal the sore.  Only use mouth rinses or medicines for pain or discomfort as directed by your caregiver. SEEK MEDICAL CARE IF:   Your symptoms are not better in 1 week.  Your sores are still present after 2 weeks.  Your sores are very painful.  You have trouble breathing or swallowing.  Your sores come back frequently. Document Released: 09/29/2010 Document Revised: 09/29/2012 Document Reviewed: 09/29/2010 [facility] Patient Information 2015 [place], [place]. This information is not intended to replace advice given to you by your health care provider. Make sure you discuss any questions you have with your health care provider.

## 2013-12-24 NOTE — Assessment & Plan Note (Signed)
Refilling controlling, weight did stabilize. He did have a 30 pound weight loss.

## 2014-01-21 ENCOUNTER — Encounter: Payer: Self-pay | Admitting: Sports Medicine

## 2014-01-21 ENCOUNTER — Ambulatory Visit (INDEPENDENT_AMBULATORY_CARE_PROVIDER_SITE_OTHER): Payer: 59 | Admitting: Sports Medicine

## 2014-01-21 VITALS — BP 114/70 | HR 63 | Ht 69.0 in | Wt 175.0 lb

## 2014-01-21 DIAGNOSIS — K120 Recurrent oral aphthae: Secondary | ICD-10-CM

## 2014-01-21 DIAGNOSIS — Z0000 Encounter for general adult medical examination without abnormal findings: Secondary | ICD-10-CM

## 2014-01-21 DIAGNOSIS — M79609 Pain in unspecified limb: Secondary | ICD-10-CM

## 2014-01-21 DIAGNOSIS — Z299 Encounter for prophylactic measures, unspecified: Secondary | ICD-10-CM

## 2014-01-21 DIAGNOSIS — M79641 Pain in right hand: Secondary | ICD-10-CM

## 2014-01-21 DIAGNOSIS — E669 Obesity, unspecified: Secondary | ICD-10-CM

## 2014-01-21 DIAGNOSIS — G8929 Other chronic pain: Secondary | ICD-10-CM

## 2014-01-21 MED ORDER — PHENTERMINE HCL 37.5 MG PO TABS: 18.7500 mg | ORAL_TABLET | Freq: Every day | ORAL | Status: DC

## 2014-01-21 NOTE — Assessment & Plan Note (Signed)
Trigger finger on the right third, he can come in for injection as needed.

## 2014-01-21 NOTE — Progress Notes (Signed)
  Subjective:    CC:  Followup  HPI: Obesity: Plateaued at 30 pounds of weight loss.  Aphthous ulcers: Resolved with topical Kenalog.  Preventive measure: Desires physical.  Past medical history, Surgical history, Family history not pertinant except as noted below, Social history, Allergies, and medications have been entered into the medical record, reviewed, and no changes needed.   Review of Systems: No fevers, chills, night sweats, weight loss, chest pain, or shortness of breath.   Objective:    General: Well Developed, well nourished, and in no acute distress.  Neuro: Alert and oriented x3, extra-ocular muscles intact, sensation grossly intact. Cranial nerves II through XII are intact, motor, sensory, and coordinative functions are all intact. HEENT: Normocephalic, atraumatic, pupils equal round reactive to light, neck supple, no masses, no lymphadenopathy, thyroid nonpalpable. Oropharynx, nasopharynx, external ear canals are unremarkable. Skin: Warm and dry, no rashes noted.  Cardiac: Regular rate and rhythm, no murmurs rubs or gallops.  Respiratory: Clear to auscultation bilaterally. Not using accessory muscles, speaking in full sentences.  Abdominal: Soft, nontender, nondistended, positive bowel sounds, no masses, no organomegaly.  Musculoskeletal: Shoulder, elbow, wrist, hip, knee, ankle stable, and with full range of motion.  Impression and Recommendations:

## 2014-01-21 NOTE — Assessment & Plan Note (Signed)
Well controlled with kenalog paste

## 2014-01-21 NOTE — Assessment & Plan Note (Signed)
CPE as above. 

## 2014-01-21 NOTE — Assessment & Plan Note (Signed)
Switching to half to dose phentermine. Plateaued at 30 pounds weight loss.

## 2014-02-14 ENCOUNTER — Emergency Department
Admission: EM | Admit: 2014-02-14 | Discharge: 2014-02-14 | Disposition: A | Discharge status: Home / Self Care | Payer: 59 | Source: Home / Self Care | Attending: Family Medicine | Admitting: Family Medicine

## 2014-02-14 ENCOUNTER — Encounter: Payer: Self-pay | Admitting: Emergency Medicine

## 2014-02-14 ENCOUNTER — Emergency Department (INDEPENDENT_AMBULATORY_CARE_PROVIDER_SITE_OTHER): Payer: 59

## 2014-02-14 DIAGNOSIS — M79609 Pain in unspecified limb: Secondary | ICD-10-CM

## 2014-02-14 DIAGNOSIS — S92919A Unspecified fracture of unspecified toe(s), initial encounter for closed fracture: Secondary | ICD-10-CM

## 2014-02-14 DIAGNOSIS — S93401A Sprain of unspecified ligament of right ankle, initial encounter: Secondary | ICD-10-CM

## 2014-02-14 DIAGNOSIS — S93409A Sprain of unspecified ligament of unspecified ankle, initial encounter: Secondary | ICD-10-CM

## 2014-02-14 DIAGNOSIS — S92414A Nondisplaced fracture of proximal phalanx of right great toe, initial encounter for closed fracture: Secondary | ICD-10-CM

## 2014-02-14 DIAGNOSIS — M25579 Pain in unspecified ankle and joints of unspecified foot: Secondary | ICD-10-CM

## 2014-02-14 MED ORDER — OXYCODONE-ACETAMINOPHEN 5-325 MG PO TABS: ORAL_TABLET | ORAL | Status: DC

## 2014-02-14 NOTE — ED Notes (Signed)
Patient transported to X-ray 

## 2014-02-14 NOTE — ED Notes (Signed)
Rolled/twisted right foot and ankle in river while pulling a boat today.  Pain and swelling to lateral ankle and proximal foot area.  Patient reports that he cannot bear weight on right foot due to pain.  Patient has previous history of right ankle surgery several years ago.

## 2014-02-14 NOTE — ED Provider Notes (Signed)
CSN: [id number]     Arrival date & time 02/14/14  1147 History   First MD Initiated Contact with Patient 02/14/14 1250     Chief Complaint  Patient presents with  . Foot Injury  . Ankle Pain      HPI Comments: Patient twisted his right foot/ankle in river while pulling his boat today.  He has had pain and swelling to his lateral ankle and proximal foot.  He has a past history of right ankle surgery several years ago  Patient is a 43 y.o. male presenting with ankle pain. The history is provided by the patient.  Ankle Pain Location:  Ankle and foot Time since incident:  4 hours Injury: yes   Mechanism of injury comment:  Inverted ankle Ankle location:  R ankle Foot location:  R foot Pain details:    Quality:  Aching   Radiates to:  Does not radiate   Severity:  Moderate   Onset quality:  Sudden   Duration:  4 hours   Timing:  Constant   Progression:  Unchanged Chronicity:  New Prior injury to area:  Yes Relieved by:  None tried Worsened by:  Bearing weight Ineffective treatments:  None tried Associated symptoms: decreased ROM, stiffness and swelling   Associated symptoms: no back pain, no muscle weakness, no numbness and no tingling     Past Medical History  Diagnosis Date  . Ganglion and cyst of synovium, tendon and bursa   . Hypertension   . Hyperlipidemia    Past Surgical History  Procedure Laterality Date  . Orthopedic surgery     Family History  Problem Relation Age of Onset  . Thyroid disease Mother   . Stroke Father    History  Substance Use Topics  . Smoking status: Former Smoker    Types: Cigarettes    Quit date: 04/21/2008  . Smokeless tobacco: Former Neurosurgeon  . Alcohol Use: No    Review of Systems  Musculoskeletal: Positive for stiffness. Negative for back pain.  All other systems reviewed and are negative.   Allergies  Ivp dye  Home Medications   Prior to Admission medications   Medication Sig Start Date End Date Taking? Authorizing  Provider  cyclobenzaprine (FLEXERIL) 10 MG tablet One half tab PO qHS, then increase gradually to one tab TID. 10/22/13   Monica Becton, MD  levothyroxine (SYNTHROID) 150 MCG tablet Take 1 tablet (150 mcg total) by mouth daily. 06/05/12 06/05/13  Hassan Rowan, MD  lisinopril-hydrochlorothiazide (PRINZIDE,ZESTORETIC) 20-25 MG per tablet Take 0.5 tablets by mouth daily. 11/23/13   Monica Becton, MD  meloxicam (MOBIC) 15 MG tablet Take 1 tablet (15 mg total) by mouth daily. 08/24/13   Monica Becton, MD  oxyCODONE-acetaminophen (ROXICET) 5-325 MG per tablet Take one tab by mouth HS prn pain 02/14/14   Lattie Haw, MD  phentermine (ADIPEX-P) 37.5 MG tablet Take 0.5 tablets (18.75 mg total) by mouth daily before breakfast. 01/21/14   Monica Becton, MD  pravastatin (PRAVACHOL) 40 MG tablet Take 1 tablet (40 mg total) by mouth daily. 07/07/13   Monica Becton, MD  triamcinolone (KENALOG) 0.1 % paste Use as directed 1 application in the mouth or throat 2 (two) times daily. 12/24/13   Monica Becton, MD   BP 119/76  Pulse 71  Temp(Src) 98.2 F (36.8 C) (Oral)  Resp 16  Ht  (1.753 m)  Wt 171 lb (77.565 kg)  BMI 25.24 kg/m2  SpO2 98% Physical  Exam  Nursing note and vitals reviewed. Constitutional: He is oriented to person, place, and time. He appears well-developed and well-nourished. No distress.  HENT:  Head: Atraumatic.  Eyes: Conjunctivae are normal. Pupils are equal, round, and reactive to light.  Musculoskeletal:       Right ankle: He exhibits decreased range of motion and swelling. He exhibits no ecchymosis, no deformity, no laceration and normal pulse. Tenderness. Lateral malleolus and AITFL tenderness found. No head of 5th metatarsal and no proximal fibula tenderness found. Achilles tendon normal.       Right foot: He exhibits decreased range of motion, tenderness, bony tenderness and swelling. He exhibits normal capillary refill, no crepitus and no  deformity.       Feet:  There is tenderness at the base of the right great toe proximal phalanx as noted on diagram.  There is tenderness and swelling over the lateral malleolus.    Neurological: He is alert and oriented to person, place, and time.  Skin: Skin is warm and dry.    ED Course  Procedures  none  Imaging Review Dg Ankle Complete Right  02/14/2014   CLINICAL DATA:  57 -year-old male with pain after inversion injury. Initial encounter.  EXAM: RIGHT ANKLE - COMPLETE 3+ VIEW  COMPARISON:  Right ankle series 730 2014.  FINDINGS: Very advanced for age degenerative changes at the right ankle, with mortise joint space loss and bulky degenerative spurring. Subchondral sclerosis, severe at the tibial plafond. No joint effusion identified. Evidence of healed fracture of the distal right fibula meta diaphysis. Calcaneus appears intact. No superimposed acute fracture or dislocation identified.  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 No superimposed acute fracture or dislocation identified.   Electronically Signed   By: Augusto Gamble M.D.   On: 02/14/2014 12:46   Dg Foot Complete Right  02/14/2014   CLINICAL DATA:  29 -year-old male with pain after inversion injury. Initial encounter.  EXAM: RIGHT FOOT COMPLETE - 3+ VIEW  COMPARISON:  Right ankle 01/14/2013.  FINDINGS: Pes planus. Calcaneus intact. Advanced degenerative changes at the mortise joint, see right ankle series from today reported separately. Elsewhere joint spaces and alignment are preserved. Nondisplaced fracture versus artifact at the medial base of the right first proximal phalanx, seen only on image 1 (arrow). Other osseous structures of the right foot appear intact.  IMPRESSION: 1. Nondisplaced fracture versus artifactual lucency traversing the medial base of the right first proximal phalanx. Correlate for point tenderness which would corroborate a fracture. 2. No other acute fracture  or dislocation identified, See right ankle series today reported separately.   Electronically Signed   By: Augusto Gamble M.D.   On: 02/14/2014 12:44     MDM   1. Right ankle sprain, initial encounter   2. Closed nondisplaced fracture of proximal phalanx of right great toe, initial encounter    Ace wrap applied.  Crutches dispensed.  Rx for Percocet at bedtime prn. Apply ice pack for 30 minutes every 1 to 2 hours today and tomorrow.  Elevate.  Use crutches for 3 to 5 days.  Wear Ace wrap until swelling decreases.  May continue Mobic Followup with Dr. Rodney [facility] ([facility] in four days.    Lattie Haw, MD 02/19/14 [id number]

## 2014-02-14 NOTE — ED Notes (Signed)
Patient returned to exam room 

## 2014-02-14 NOTE — Discharge Instructions (Signed)
Apply ice pack for 30 minutes every 1 to 2 hours today and tomorrow.  Elevate.  Use crutches for 3 to 5 days.  Wear Ace wrap until swelling decreases.  May continue Mobic

## 2014-02-18 ENCOUNTER — Ambulatory Visit (INDEPENDENT_AMBULATORY_CARE_PROVIDER_SITE_OTHER): Payer: 59 | Admitting: Sports Medicine

## 2014-02-18 ENCOUNTER — Encounter: Payer: Self-pay | Admitting: Sports Medicine

## 2014-02-18 VITALS — BP 122/75 | HR 71 | Ht 69.0 in | Wt 176.0 lb

## 2014-02-18 DIAGNOSIS — S93601A Unspecified sprain of right foot, initial encounter: Secondary | ICD-10-CM

## 2014-02-18 DIAGNOSIS — M109 Gout, unspecified: Secondary | ICD-10-CM | POA: Insufficient documentation

## 2014-02-18 DIAGNOSIS — S93609A Unspecified sprain of unspecified foot, initial encounter: Secondary | ICD-10-CM

## 2014-02-18 MED ORDER — OXYCODONE-ACETAMINOPHEN 5-325 MG PO TABS: 1.0000 | ORAL_TABLET | Freq: Three times a day (TID) | ORAL | Status: DC | PRN

## 2014-02-18 NOTE — Assessment & Plan Note (Signed)
First interphalangeal sprain as well as perineal strain. CAM boot for 2 weeks, Percocet for pain. Return to see me in 3 weeks to release

## 2014-02-18 NOTE — Progress Notes (Signed)
Patient ID: Drew Cabrera, male   DOB: Oct 21, 1970, 43 y.o.   MRN: [id number]    Subjective:    I'm seeing this patient as a consultation for: Drew Christen, MD  CC: Right foot injury  HPI: Drew Cabrera is a 43 year old male with remote history of right ankle surgery who presents after [place] his right [place] 4 days ago (8/30) and subsequently had pain and swelling to the lateral ankle and proximal foot with inability to bear weight. He was seen by Dr. Cathren Harsh in our [facility] on 8/30, where complete foot x-ray showed a nondisplaced fracture versus artifactual lucency traversing the medial base of the right first proximal phalanx. Complete 3+ view of the right ankle was also done and showed advanced arthropathy, likely attributable to previous history of right ankle surgery, but no acute fracture or dislocation. Today, he reports that the swelling has decreased significantly, but pain of the toe and ankle (worse with walking and palpation) still remains. Has not been using the crutches because they cause increased pain in his back and right knee. Has taken a percocet, which provided relief.  Past medical history, Surgical history, Family history not pertinant except as noted below, Social history, Allergies, and medications have been entered into the medical record, reviewed, and no changes needed.   Review of Systems: No headache, visual changes, nausea, vomiting, diarrhea, constipation, dizziness, abdominal pain, skin rash, fevers, chills, night sweats, weight loss, swollen lymph nodes, body aches, joint swelling, muscle aches, chest pain, shortness of breath, mood changes, visual or auditory hallucinations.   Objective:   General: Well developed, well nourished, and in no acute distress.  Neuro/Psych: Alert and oriented x3, extra-ocular muscles intact, able to move all 4 extremities, sensation grossly intact. Skin: Warm and dry, no rashes noted.    Respiratory: Not using accessory muscles, speaking in full sentences, trachea midline.  Cardiovascular: Pulses palpable, no extremity edema. Abdomen: Does not appear distended.  Right Foot: No visible erythema. Bruising remains over the first toe. Range of motion is full in all directions. Strength 3/5 with eversion. No hallux valgus. No pes cavus or pes planus. No abnormal callus noted. No pain over the navicular prominence, or base of fifth metatarsal. No tenderness to palpation of the calcaneal insertion of plantar fascia. No pain at the Achilles insertion. No pain over the calcaneal bursa. No pain of the retrocalcaneal bursa. Tenderness to palpation over the dorsum of the first distal phalanx. No tenderness to palpation over the potential site of fracture at the first proximal phalanx or any other tarsal, metatarsal, or phalanx. No hallux rigidus or limitus. No tenderness palpation over interphalangeal joints. No pain with compression of the metatarsal heads. Neurovascularly intact distally.  Right Ankle: No visible erythema. Swelling over the lateral ankle. Range of motion is full in all directions. Strength 3/5 with eversion, full in all other directions. Stable lateral and medial ligaments; squeeze test and kleiger test unremarkable; Talar dome nontender; No pain at base of 5th MT; No tenderness over cuboid; No tenderness over N spot or navicular prominence No tenderness on posterior aspects of lateral and medial malleolus Positive peroneal tenderness to palpation without evidence of subluxation. Negative tarsal tunnel tinel's Able to walk 4 steps.  Impression and Recommendations:   This case required medical decision making of moderate complexity.  Right ankle pain: Peroneal sprain is suggested in this patient with history of inversion injury to the ankle, continued pain and  swelling of the lateral ankle, and exam with tenderness over the peroneal tendon and weakness  of eversion. - CAM boot - Percocet PRN for pain - Return in 3 weeks  Right first toe pain: While previous x-ray showed a possible fracture traversing the medial base of the right first proximal phalanx, lack of tenderness over the first proximal phalanx on exam suggests a sprain of an interphalangeal muscle rather true fracture. - CAM boot - Percocet PRN for pain - Return in 3 weeks

## 2014-03-12 ENCOUNTER — Ambulatory Visit: Payer: 59 | Admitting: Sports Medicine

## 2014-03-16 ENCOUNTER — Ambulatory Visit: Payer: 59 | Admitting: Sports Medicine

## 2014-04-23 ENCOUNTER — Ambulatory Visit (INDEPENDENT_AMBULATORY_CARE_PROVIDER_SITE_OTHER): Payer: 59 | Admitting: Sports Medicine

## 2014-04-23 ENCOUNTER — Encounter: Payer: Self-pay | Admitting: Sports Medicine

## 2014-04-23 VITALS — BP 115/74 | HR 69 | Wt 174.0 lb

## 2014-04-23 DIAGNOSIS — E669 Obesity, unspecified: Secondary | ICD-10-CM

## 2014-04-23 DIAGNOSIS — S93601A Unspecified sprain of right foot, initial encounter: Secondary | ICD-10-CM

## 2014-04-23 DIAGNOSIS — G8929 Other chronic pain: Secondary | ICD-10-CM

## 2014-04-23 DIAGNOSIS — M25571 Pain in right ankle and joints of right foot: Secondary | ICD-10-CM

## 2014-04-23 MED ORDER — OXYCODONE-ACETAMINOPHEN 5-325 MG PO TABS: 1.0000 | ORAL_TABLET | Freq: Three times a day (TID) | ORAL | Status: DC | PRN

## 2014-04-23 MED ORDER — PHENTERMINE HCL 37.5 MG PO TABS: 18.7500 mg | ORAL_TABLET | Freq: Every day | ORAL | Status: DC

## 2014-04-23 NOTE — Assessment & Plan Note (Signed)
Refilling phentermine. 

## 2014-04-23 NOTE — Assessment & Plan Note (Signed)
Refilling pain medicines

## 2014-04-23 NOTE — Progress Notes (Signed)
  Subjective:    CC: follow-up  HPI: Foot sprain: Resolved.  Chronic ankle posttraumatic osteoarthritis: Needs a refill on pain medication.  Weight management: Needs a refill on one half tab phentermine.  Past medical history, Surgical history, Family history not pertinant except as noted below, Social history, Allergies, and medications have been entered into the medical record, reviewed, and no changes needed.   Review of Systems: No fevers, chills, night sweats, weight loss, chest pain, or shortness of breath.   Objective:    General: Well Developed, well nourished, and in no acute distress.  Neuro: Alert and oriented x3, extra-ocular muscles intact, sensation grossly intact.  HEENT: Normocephalic, atraumatic, pupils equal round reactive to light, neck supple, no masses, no lymphadenopathy, thyroid nonpalpable.  Skin: Warm and dry, no rashes. Cardiac: Regular rate and rhythm, no murmurs rubs or gallops, no lower extremity edema.  Respiratory: Clear to auscultation bilaterally. Not using accessory muscles, speaking in full sentences. Right Foot: No visible erythema or swelling. Range of motion is full in all directions. Strength is 5/5 in all directions. No hallux valgus. No pes cavus or pes planus. No abnormal callus noted. No pain over the navicular prominence, or base of fifth metatarsal. No tenderness to palpation of the calcaneal insertion of plantar fascia. No pain at the Achilles insertion. No pain over the calcaneal bursa. No pain of the retrocalcaneal bursa. No tenderness to palpation over the tarsals, metatarsals, or phalanges. No hallux rigidus or limitus. No tenderness palpation over interphalangeal joints. No pain with compression of the metatarsal heads. Neurovascularly intact distally.  Impression and Recommendations:

## 2014-04-23 NOTE — Assessment & Plan Note (Signed)
Resolved

## 2014-06-14 IMAGING — CR DG LUMBAR SPINE COMPLETE 4+V
5 series · 5 of 5 positions shown · non-contrast
Comparison: None.

CLINICAL DATA: Low back and right lower extremity pain.

LUMBAR SPINE - COMPLETE 4+ VIEW

[t l-spine a.p.]
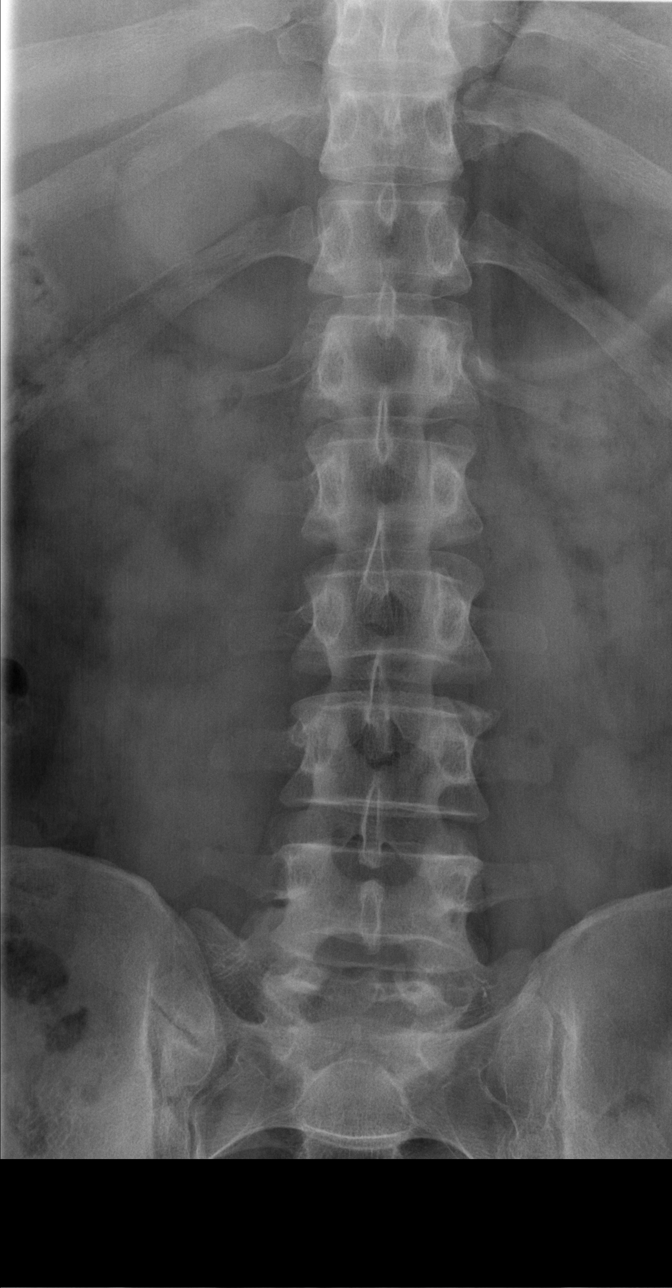

[t l-spine oblique exposure (1 of 2)]
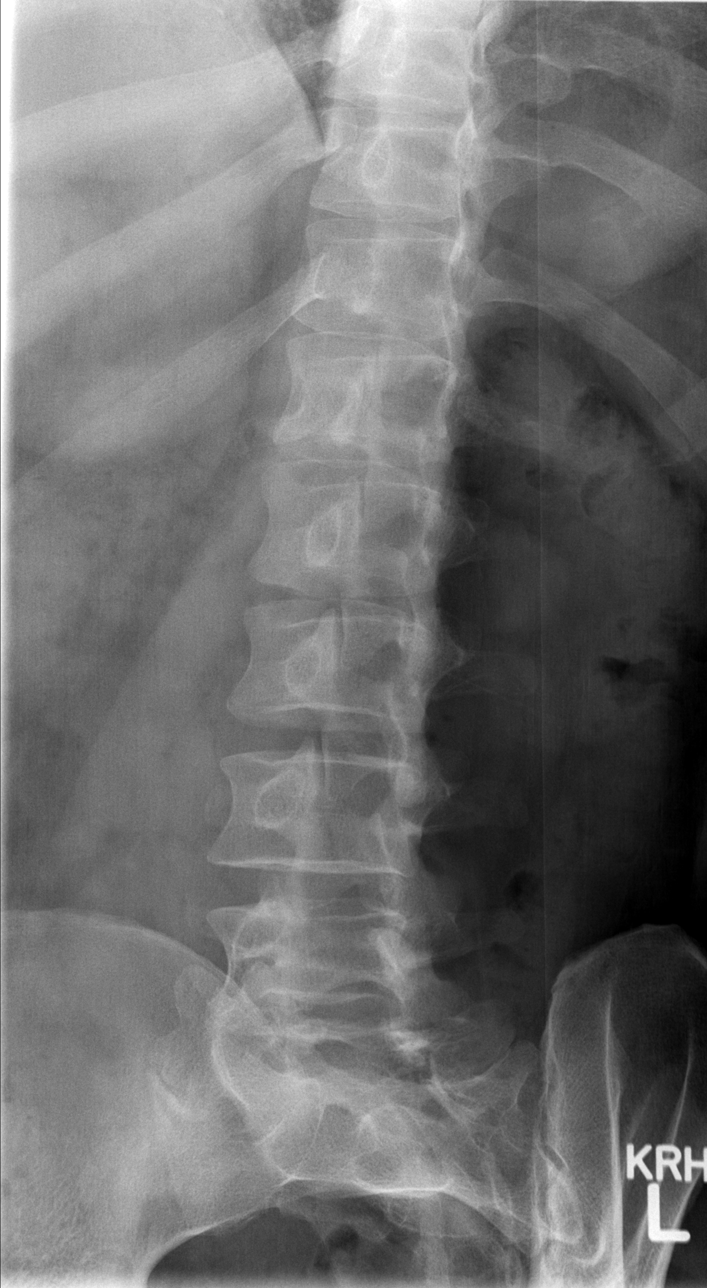

[t l-spine oblique exposure (2 of 2)]
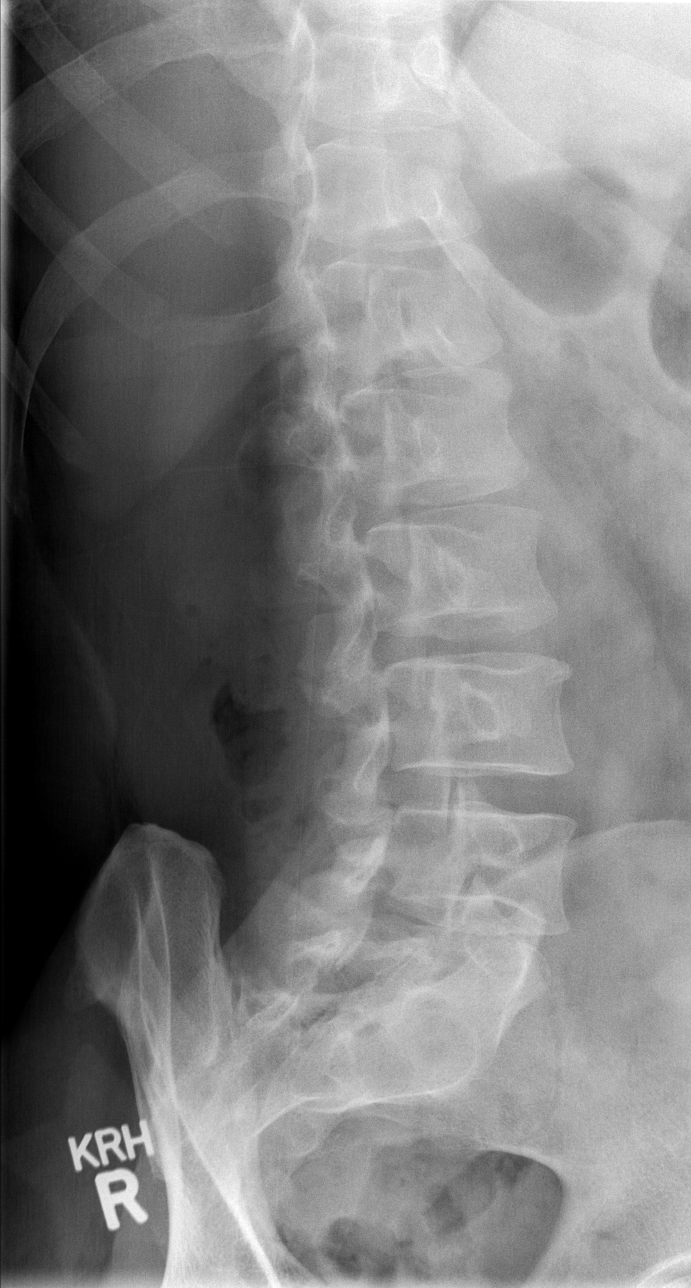

[t l-spine lat]
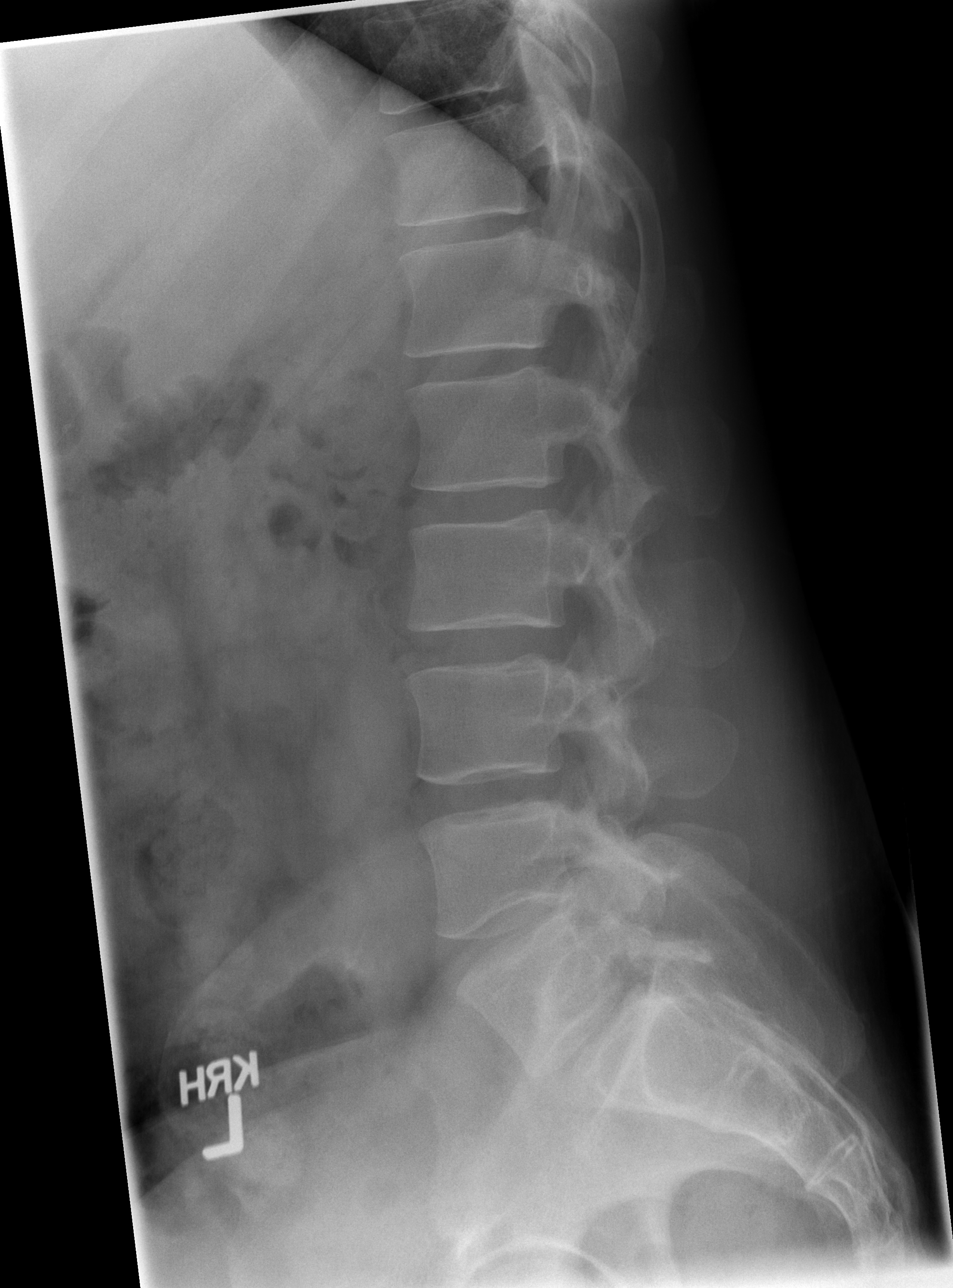

[t l-spine l5-s1 spot]
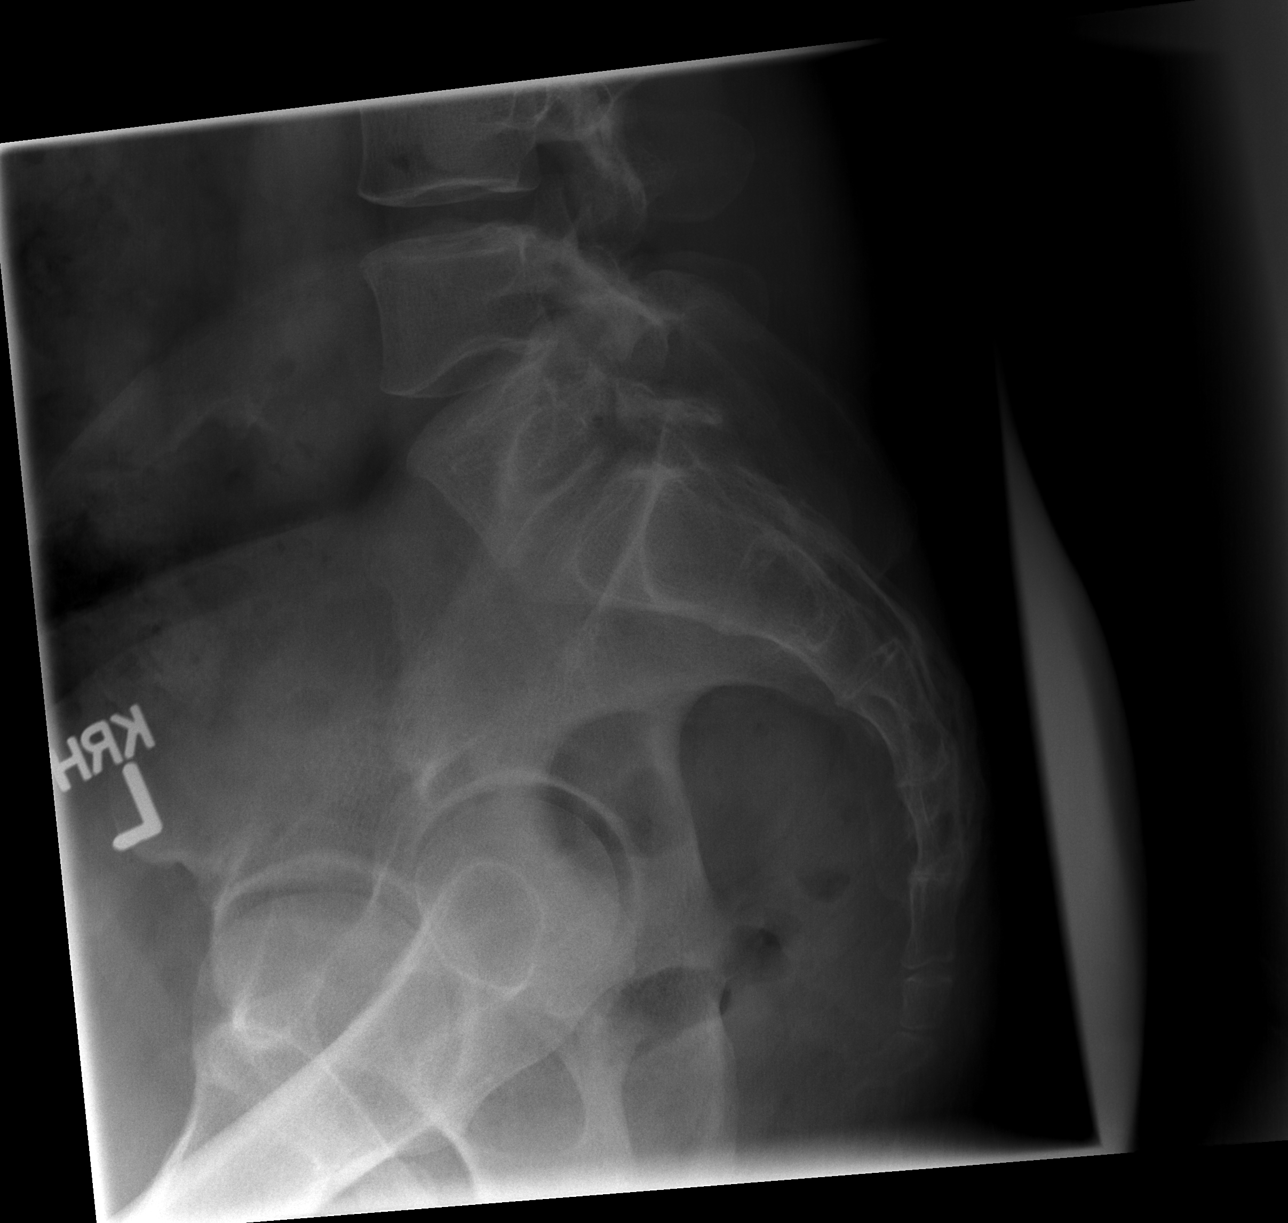

[5 of 5 positions shown; findings below may reference images not displayed]

FINDINGS: There is a transitional lumbosacral segment. There is no
evidence of lumbar spine fracture.  Alignment is normal.
Intervertebral disc spaces are maintained.
IMPRESSION: Negative.

## 2014-06-23 ENCOUNTER — Ambulatory Visit (INDEPENDENT_AMBULATORY_CARE_PROVIDER_SITE_OTHER): Payer: 59 | Admitting: Physician Assistant

## 2014-06-23 ENCOUNTER — Encounter: Payer: Self-pay | Admitting: Physician Assistant

## 2014-06-23 VITALS — BP 128/77 | HR 87 | Temp 98.3°F | Ht 69.0 in | Wt 178.0 lb

## 2014-06-23 DIAGNOSIS — J069 Acute upper respiratory infection, unspecified: Secondary | ICD-10-CM

## 2014-06-23 MED ORDER — AMOXICILLIN-POT CLAVULANATE 875-125 MG PO TABS: 1.0000 | ORAL_TABLET | Freq: Two times a day (BID) | ORAL | Status: DC

## 2014-06-23 MED ORDER — PREDNISONE 50 MG PO TABS: ORAL_TABLET | ORAL | Status: DC

## 2014-06-23 NOTE — Progress Notes (Signed)
   Subjective:    Patient ID: Drew Cabrera, male    DOB: 08/23/1970, 44 y.o.   MRN: [id number]  HPI  Pt presents to the clinic with chief complaint of ST, cough, ear pain. Symptoms started 4 days ago. They have continued to worsen despite cough drops, decongestants, nasal sprays. No fever or chills. Some sinus pressure. Ears are popping and have a lot of pressure behind them. ST is worsening every day. Not able to swallow without pain. Cough is not productive. No wheezing or SOB. No one else is sick.     Review of Systems  All other systems reviewed and are negative.      Objective:   Physical Exam  Constitutional: He is oriented to person, place, and time. He appears well-developed and well-nourished.  HENT:  Head: Normocephalic and atraumatic.  Right Ear: External ear normal.  Left Ear: External ear normal.  TM's erythematous with no blood or pus.  Oropharynx erythematous with bilateral enlarged erythematous tonsils, no exudate.  Frontal sinus tenderness to palpation.  Nasal turbinates red and swollen.    Eyes: Conjunctivae are normal. Right eye exhibits no discharge. Left eye exhibits no discharge.  Neck: Normal range of motion. Neck supple.  Bilateral anterior cervical lymph node enlargement with tenderness to palpation.   Cardiovascular: Normal rate, regular rhythm and normal heart sounds.   Pulmonary/Chest: Effort normal and breath sounds normal. He has no wheezes.  Neurological: He is alert and oriented to person, place, and time.  Skin: Skin is dry.  Psychiatric: He has a normal mood and affect. His behavior is normal.          Assessment & Plan:  Acute upper respiratory infection- augmentin for 10 days and prednisone for 5 days given. Symptomatic care discussed to continue with ibuprofen and cough drops. HO given. Follow up as needed or if symptoms worsen.

## 2014-06-23 NOTE — Patient Instructions (Signed)
Upper Respiratory Infection, Adult An upper respiratory infection (URI) is also sometimes known as the common cold. The upper respiratory tract includes the nose, sinuses, throat, trachea, and bronchi. Bronchi are the airways leading to the lungs. Most people improve within 1 week, but symptoms can last up to 2 weeks. A residual cough may last even longer.  CAUSES Many different viruses can infect the tissues lining the upper respiratory tract. The tissues become irritated and inflamed and often become very moist. Mucus production is also common. A cold is contagious. You can easily spread the virus to others by oral contact. This includes kissing, sharing a glass, coughing, or sneezing. Touching your mouth or nose and then touching a surface, which is then touched by another person, can also spread the virus. SYMPTOMS  Symptoms typically develop 1 to 3 days after you come in contact with a cold virus. Symptoms vary from person to person. They may include:  Runny nose.  Sneezing.  Nasal congestion.  Sinus irritation.  Sore throat.  Loss of voice (laryngitis).  Cough.  Fatigue.  Muscle aches.  Loss of appetite.  Headache.  Low-grade fever. DIAGNOSIS  You might diagnose your own cold based on familiar symptoms, since most people get a cold 2 to 3 times a year. Your caregiver can confirm this based on your exam. Most importantly, your caregiver can check that your symptoms are not due to another disease such as strep throat, sinusitis, pneumonia, asthma, or epiglottitis. Blood tests, throat tests, and X-rays are not necessary to diagnose a common cold, but they may sometimes be helpful in excluding other more serious diseases. Your caregiver will decide if any further tests are required. RISKS AND COMPLICATIONS  You may be at risk for a more severe case of the common cold if you smoke cigarettes, have chronic heart disease (such as heart failure) or lung disease (such as asthma), or if  you have a weakened immune system. The very young and very old are also at risk for more serious infections. Bacterial sinusitis, middle ear infections, and bacterial pneumonia can complicate the common cold. The common cold can worsen asthma and chronic obstructive pulmonary disease (COPD). Sometimes, these complications can require emergency medical care and may be life-threatening. PREVENTION  The best way to protect against getting a cold is to practice good hygiene. Avoid oral or hand contact with people with cold symptoms. Wash your hands often if contact occurs. There is no clear evidence that vitamin C, vitamin E, echinacea, or exercise reduces the chance of developing a cold. However, it is always recommended to get plenty of rest and practice good nutrition. TREATMENT  Treatment is directed at relieving symptoms. There is no cure. Antibiotics are not effective, because the infection is caused by a virus, not by bacteria. Treatment may include:  Increased fluid intake. Sports drinks offer valuable electrolytes, sugars, and fluids.  Breathing heated mist or steam (vaporizer or shower).  Eating chicken soup or other clear broths, and maintaining good nutrition.  Getting plenty of rest.  Using gargles or lozenges for comfort.  Controlling fevers with ibuprofen or acetaminophen as directed by your caregiver.  Increasing usage of your inhaler if you have asthma. Zinc gel and zinc lozenges, taken in the first 24 hours of the common cold, can shorten the duration and lessen the severity of symptoms. Pain medicines may help with fever, muscle aches, and throat pain. A variety of non-prescription medicines are available to treat congestion and runny nose. Your caregiver   can make recommendations and may suggest nasal or lung inhalers for other symptoms.  HOME CARE INSTRUCTIONS   Only take over-the-counter or prescription medicines for pain, discomfort, or fever as directed by your  caregiver.  Use a warm mist humidifier or inhale steam from a shower to increase air moisture. This may keep secretions moist and make it easier to breathe.  Drink enough water and fluids to keep your urine clear or pale yellow.  Rest as needed.  Return to work when your temperature has returned to normal or as your caregiver advises. You may need to stay home longer to avoid infecting others. You can also use a face mask and careful hand washing to prevent spread of the virus. SEEK MEDICAL CARE IF:   After the first few days, you feel you are getting worse rather than better.  You need your caregiver's advice about medicines to control symptoms.  You develop chills, worsening shortness of breath, or brown or red sputum. These may be signs of pneumonia.  You develop yellow or brown nasal discharge or pain in the face, especially when you bend forward. These may be signs of sinusitis.  You develop a fever, swollen neck glands, pain with swallowing, or white areas in the back of your throat. These may be signs of strep throat. SEEK IMMEDIATE MEDICAL CARE IF:   You have a fever.  You develop severe or persistent headache, ear pain, sinus pain, or chest pain.  You develop wheezing, a prolonged cough, cough up blood, or have a change in your usual mucus (if you have chronic lung disease).  You develop sore muscles or a stiff neck. Document Released: 11/28/2000 Document Revised: 08/27/2011 Document Reviewed: 09/09/2013 [employer] Patient Information 2015 [employer]. This information is not intended to replace advice given to you by your health care provider. Make sure you discuss any questions you have with your health care provider.  

## 2014-07-20 ENCOUNTER — Encounter: Payer: Self-pay | Admitting: Sports Medicine

## 2014-07-20 ENCOUNTER — Ambulatory Visit (INDEPENDENT_AMBULATORY_CARE_PROVIDER_SITE_OTHER): Payer: 59 | Admitting: Sports Medicine

## 2014-07-20 VITALS — BP 107/67 | HR 68 | Wt 177.0 lb

## 2014-07-20 DIAGNOSIS — M7662 Achilles tendinitis, left leg: Secondary | ICD-10-CM

## 2014-07-20 MED ORDER — PREDNISONE 50 MG PO TABS: ORAL_TABLET | ORAL | Status: DC

## 2014-07-20 NOTE — Assessment & Plan Note (Signed)
This is extensive with palpable crepitus and tenosynovitis after a long period of time when his kayak. There is also a trace amount of tibialis posterior tendinitis. No evidence of rupture, ultrasound does show some mild neovascularization with thickening in the mid Achilles.  At this point we are going to proceed conservatively with 5 days of prednisone, boot immobilization with a heel lift for at least one week. Out of work until next week, and then light duty afterwards. Return to see me in 1-2 weeks, if persistent symptoms we will do an ultrasound guided injection around the tenosynovium of the Achilles, we will avoid intratendinous injection due to risk of rupture. Topical modalities such as elevation, compression, and icing will be very helpful.

## 2014-07-20 NOTE — Progress Notes (Signed)
  Subjective:    CC: Left ankle pain  HPI: This is a very pleasant 44 year old male, for 3 days he has been out of his kayak pedaling, when he returned he had increasing swelling and pain of his left ankle worse over the Achilles. Pain is severe, persistent. No swelling of the calf, no numbness or tingling. No trauma. No constitutional symptoms.  Past medical history, Surgical history, Family history not pertinant except as noted below, Social history, Allergies, and medications have been entered into the medical record, reviewed, and no changes needed.   Review of Systems: No fevers, chills, night sweats, weight loss, chest pain, or shortness of breath.   Objective:    General: Well Developed, well nourished, and in no acute distress.  Neuro: Alert and oriented x3, extra-ocular muscles intact, sensation grossly intact.  HEENT: Normocephalic, atraumatic, pupils equal round reactive to light, neck supple, no masses, no lymphadenopathy, thyroid nonpalpable.  Skin: Warm and dry, no rashes. Cardiac: Regular rate and rhythm, no murmurs rubs or gallops, no lower extremity edema.  Respiratory: Clear to auscultation bilaterally. Not using accessory muscles, speaking in full sentences. Left Ankle: Entire ankle is visibly swollen but with a negative Homans sign. There is a visible and palpable Achilles nodule with palpable crepitus as the ankle was taken through the range of motion. There is also mild tenderness over the tibialis posterior. Range of motion is full in all directions. Strength is 5/5 in all directions. Stable lateral and medial ligaments; squeeze test and kleiger test unremarkable; Talar dome nontender; No pain at base of 5th MT; No tenderness over cuboid; No tenderness over N spot or navicular prominence No tenderness on posterior aspects of lateral and medial malleolus No sign of peroneal tendon subluxations; Negative tarsal tunnel tinel's Able to walk 4 steps.  Impression and  Recommendations:

## 2014-07-21 ENCOUNTER — Ambulatory Visit: Payer: 59 | Admitting: Sports Medicine

## 2014-07-28 ENCOUNTER — Ambulatory Visit (INDEPENDENT_AMBULATORY_CARE_PROVIDER_SITE_OTHER): Payer: 59 | Admitting: Sports Medicine

## 2014-07-28 ENCOUNTER — Encounter: Payer: Self-pay | Admitting: Sports Medicine

## 2014-07-28 VITALS — BP 95/62 | HR 68 | Wt 180.0 lb

## 2014-07-28 DIAGNOSIS — M7662 Achilles tendinitis, left leg: Secondary | ICD-10-CM

## 2014-07-28 DIAGNOSIS — M25571 Pain in right ankle and joints of right foot: Secondary | ICD-10-CM

## 2014-07-28 DIAGNOSIS — S93601A Unspecified sprain of right foot, initial encounter: Secondary | ICD-10-CM

## 2014-07-28 DIAGNOSIS — M7989 Other specified soft tissue disorders: Secondary | ICD-10-CM

## 2014-07-28 DIAGNOSIS — G8929 Other chronic pain: Secondary | ICD-10-CM

## 2014-07-28 MED ORDER — OXYCODONE-ACETAMINOPHEN 5-325 MG PO TABS: 1.0000 | ORAL_TABLET | Freq: Three times a day (TID) | ORAL | Status: DC | PRN

## 2014-07-28 NOTE — Assessment & Plan Note (Signed)
Refilling pain medication

## 2014-07-28 NOTE — Assessment & Plan Note (Signed)
Aspiration and injection as above. Crystal analysis. Return in a month.

## 2014-07-28 NOTE — Assessment & Plan Note (Signed)
Resolved with prednisone and CAM boot immobilization.

## 2014-07-28 NOTE — Progress Notes (Signed)
  Subjective:    CC:  Follow-up  HPI: Left toe swelling: moderate, persistent, present for several days, exquisitely tender, swollen, warm, red. Pain is localized at the first metatarsophalangeal joint. No trauma.   Achilles tendinitis: Improved with CAM boot immobilization, and prednisone. He still has some crepitus but the pain is gone.  Past medical history, Surgical history, Family history not pertinant except as noted below, Social history, Allergies, and medications have been entered into the medical record, reviewed, and no changes needed.   Review of Systems: No fevers, chills, night sweats, weight loss, chest pain, or shortness of breath.   Objective:    General: Well Developed, well nourished, and in no acute distress.  Neuro: Alert and oriented x3, extra-ocular muscles intact, sensation grossly intact.  HEENT: Normocephalic, atraumatic, pupils equal round reactive to light, neck supple, no masses, no lymphadenopathy, thyroid nonpalpable.  Skin: Warm and dry, no rashes. Cardiac: Regular rate and rhythm, no murmurs rubs or gallops, no lower extremity edema.  Respiratory: Clear to auscultation bilaterally. Not using accessory muscles, speaking in full sentences. Left Foot: No visible erythema or swelling. Range of motion is full in all directions. Strength is 5/5 in all directions. No hallux valgus. No pes cavus or pes planus. No abnormal callus noted. No pain over the navicular prominence, or base of fifth metatarsal. No tenderness to palpation of the calcaneal insertion of plantar fascia. No pain at the Achilles insertion. No pain over the calcaneal bursa. No pain of the retrocalcaneal bursa. Visible and palpable swelling with a fluid wave of the left first metatarsophalangeal joint No hallux rigidus or limitus. No tenderness palpation over interphalangeal joints. No pain with compression of the metatarsal heads. Neurovascularly intact distally.  Procedure: Real-time  Ultrasound Guided aspiration/Injection of  Left first metatarsophalangeal joint Device: GE Logiq E  Verbal informed consent obtained.  Time-out conducted.  Noted no overlying erythema, induration, or other signs of local infection.  Skin prepped in a sterile fashion.  Local anesthesia: Topical Ethyl chloride.  With sterile technique and under real time ultrasound guidance:  Using a 25-gauge short needle and aspirated approximately 1 mL of cloudy, straw-colored fluid, syringe switched and 0.5 mL kenalog 40, 0.5 mL lidocaine injected easily. Completed without difficulty  Pain improved but did not immediately resolve.  Advised to call if fevers/chills, erythema, induration, drainage, or persistent bleeding.  Images permanently stored and available for review in the ultrasound unit.  Impression: Technically successful ultrasound guided injection.  Impression and Recommendations:

## 2014-07-29 LAB — SYNOVIAL CELL COUNT + DIFF, W/ CRYSTALS
Crystals, Fluid: NONE SEEN
Eosinophils-Synovial: 0 % (ref 0–1)
Lymphocytes-Synovial Fld: 1 % (ref 0–20)
Monocyte/Macrophage: 17 % — ABNORMAL LOW (ref 50–90)
Neutrophil, Synovial: 82 % — ABNORMAL HIGH (ref 0–25)
WBC, Synovial: 3300 cu mm — ABNORMAL HIGH (ref 0–200)

## 2014-08-03 ENCOUNTER — Ambulatory Visit: Payer: 59 | Admitting: Sports Medicine

## 2014-08-11 ENCOUNTER — Encounter: Payer: Self-pay | Admitting: Sports Medicine

## 2014-08-11 ENCOUNTER — Ambulatory Visit (INDEPENDENT_AMBULATORY_CARE_PROVIDER_SITE_OTHER): Payer: 59 | Admitting: Sports Medicine

## 2014-08-11 VITALS — BP 108/79 | HR 95 | Wt 181.0 lb

## 2014-08-11 DIAGNOSIS — M7989 Other specified soft tissue disorders: Secondary | ICD-10-CM

## 2014-08-11 DIAGNOSIS — M7662 Achilles tendinitis, left leg: Secondary | ICD-10-CM

## 2014-08-11 DIAGNOSIS — E785 Hyperlipidemia, unspecified: Secondary | ICD-10-CM

## 2014-08-11 MED ORDER — PRAVASTATIN SODIUM 40 MG PO TABS: 40.0000 mg | ORAL_TABLET | Freq: Every day | ORAL | Status: DC

## 2014-08-11 NOTE — Progress Notes (Signed)
  Subjective:    CC: Follow-up  HPI: Drew Cabrera returns, he spent several days in his kayak, pedaling insufficient. We saw him several weeks ago, he had fairly severe pain in his left Achilles with crepitus, and pain in his left first metatarsophalangeal joint, with significant swelling and effusion. I aspirated and injected his first metatarsophalangeal joint on the left side last time, crystal analysis was negative, and I subsequently injected with medication. Within 2 days his symptoms have completely resolved. Regarding his left Achilles tendon, it continues to improve slowly, he has developed a small nodule as expected, and continues to have mild crepitus.  Overall he is feeling significantly better, and is eager to discontinue his CAM boot.  Past medical history, Surgical history, Family history not pertinant except as noted below, Social history, Allergies, and medications have been entered into the medical record, reviewed, and no changes needed.   Review of Systems: No fevers, chills, night sweats, weight loss, chest pain, or shortness of breath.   Objective:    General: Well Developed, well nourished, and in no acute distress.  Neuro: Alert and oriented x3, extra-ocular muscles intact, sensation grossly intact.  HEENT: Normocephalic, atraumatic, pupils equal round reactive to light, neck supple, no masses, no lymphadenopathy, thyroid nonpalpable.  Skin: Warm and dry, no rashes. Cardiac: Regular rate and rhythm, no murmurs rubs or gallops, no lower extremity edema.  Respiratory: Clear to auscultation bilaterally. Not using accessory muscles, speaking in full sentences. Left Foot: No visible erythema or swelling. Range of motion is full in all directions. Strength is 5/5 in all directions. No hallux valgus. No pes cavus or pes planus. No abnormal callus noted. No pain over the navicular prominence, or base of fifth metatarsal. No tenderness to palpation of the calcaneal insertion of  plantar fascia. Achilles tendon has a small palpable nodule approximately 3 cm proximal to the insertion, there is mild crepitus palpable as the ankles taken to the range of motion. He has good strength. No pain over the calcaneal bursa. No pain of the retrocalcaneal bursa. No tenderness to palpation over the tarsals, metatarsals, or phalanges. No longer has any tenderness or swelling at the first MTP. No hallux rigidus or limitus. No tenderness palpation over interphalangeal joints. No pain with compression of the metatarsal heads. Neurovascularly intact distally. He does also have a leg length discrepancy, right longer than left by approximately 4 cm.  Impression and Recommendations:

## 2014-08-11 NOTE — Assessment & Plan Note (Signed)
Left Achilles tenosynovitis has continued to improve, he does still have palpable crepitus through the range of motion, I do think we can discontinue his boot at this time, and we will continue with a heel lift. He does have a leg length discrepancy, right longer than left. When his Achilles tendinitis has resolved we will continue the heel lift in the left side.

## 2014-08-11 NOTE — Assessment & Plan Note (Addendum)
Left first MTP aspiration and injection has resulted in complete pain relief. Crystal analysis was negative.

## 2014-08-26 ENCOUNTER — Ambulatory Visit (INDEPENDENT_AMBULATORY_CARE_PROVIDER_SITE_OTHER): Payer: 59 | Admitting: Sports Medicine

## 2014-08-26 ENCOUNTER — Encounter: Payer: Self-pay | Admitting: Sports Medicine

## 2014-08-26 DIAGNOSIS — M7662 Achilles tendinitis, left leg: Secondary | ICD-10-CM

## 2014-08-26 MED ORDER — DICLOFENAC SODIUM 2 % TD SOLN: 2.0000 | Freq: Two times a day (BID) | TRANSDERMAL | Status: DC

## 2014-08-26 NOTE — Progress Notes (Signed)
  Subjective:    CC: Follow-up  HPI: Achilles tendinitis: Drew Cabrera was doing well, he went on another fishing expedition, pedaling for days at a time in his kayak, unfortunately has a recurrence of pain in the posterior Achilles with crepitus. Moderate, persistent. On further questioning he does have another fishing competition coming up in a week.  Past medical history, Surgical history, Family history not pertinant except as noted below, Social history, Allergies, and medications have been entered into the medical record, reviewed, and no changes needed.   Review of Systems: No fevers, chills, night sweats, weight loss, chest pain, or shortness of breath.   Objective:    General: Well Developed, well nourished, and in no acute distress.  Neuro: Alert and oriented x3, extra-ocular muscles intact, sensation grossly intact.  HEENT: Normocephalic, atraumatic, pupils equal round reactive to light, neck supple, no masses, no lymphadenopathy, thyroid nonpalpable.  Skin: Warm and dry, no rashes. Cardiac: Regular rate and rhythm, no murmurs rubs or gallops, no lower extremity edema.  Respiratory: Clear to auscultation bilaterally. Not using accessory muscles, speaking in full sentences. Left ankle: Tender to palpation over the mid Achilles with palpable crepitus as the ankle was taken through the range of motion.  Procedure: Diagnostic Ultrasound of left lower extremity Device: GE Logiq E  Findings: Noted thickening and hypoechoic change in the peritenon of the Achilles without any neovascularization into the tendon substance itself. Images permanently stored and available for review in the ultrasound unit.  Impression: Achilles tenosynovitis without Achilles tendinosis  Impression and Recommendations:

## 2014-08-26 NOTE — Assessment & Plan Note (Signed)
Still persistent, this is after a long fishing trip [place] in his kayak. He does have significant tenosynovitis on ultrasound, with no significant neovascularization into the tendon itself. This suggests that the disease process. It is predominantly tenosynovitis rather than Achilles tendinosis. I would like to see him back after his next fishing championship, and at that point if still having symptoms we can consider peritendinous injection, risks explained. He will use topical Pennsaid in the meantime.

## 2014-08-27 ENCOUNTER — Ambulatory Visit: Payer: 59 | Admitting: Sports Medicine

## 2014-08-30 ENCOUNTER — Ambulatory Visit: Payer: 59 | Admitting: Sports Medicine

## 2014-09-08 ENCOUNTER — Encounter: Payer: Self-pay | Admitting: Sports Medicine

## 2014-09-08 ENCOUNTER — Ambulatory Visit (INDEPENDENT_AMBULATORY_CARE_PROVIDER_SITE_OTHER): Payer: 59 | Admitting: Sports Medicine

## 2014-09-08 VITALS — BP 106/66 | HR 68 | Wt 183.0 lb

## 2014-09-08 DIAGNOSIS — E785 Hyperlipidemia, unspecified: Secondary | ICD-10-CM

## 2014-09-08 DIAGNOSIS — M7662 Achilles tendinitis, left leg: Secondary | ICD-10-CM | POA: Diagnosis not present

## 2014-09-08 NOTE — Assessment & Plan Note (Signed)
Triglycerides have come close to normal. We are going to recheck his lipid panel, we can probably at a very low dose of fenofibrate if continues to be above 150.

## 2014-09-08 NOTE — Progress Notes (Signed)
  Subjective:    CC: Follow-up  HPI: Achilles tendinitis: Resolved.  Hypertriglyceridemia: Due for a recheck.  Past medical history, Surgical history, Family history not pertinant except as noted below, Social history, Allergies, and medications have been entered into the medical record, reviewed, and no changes needed.   Review of Systems: No fevers, chills, night sweats, weight loss, chest pain, or shortness of breath.   Objective:    General: Well Developed, well nourished, and in no acute distress.  Neuro: Alert and oriented x3, extra-ocular muscles intact, sensation grossly intact.  HEENT: Normocephalic, atraumatic, pupils equal round reactive to light, neck supple, no masses, no lymphadenopathy, thyroid nonpalpable.  Skin: Warm and dry, no rashes. Cardiac: Regular rate and rhythm, no murmurs rubs or gallops, no lower extremity edema.  Respiratory: Clear to auscultation bilaterally. Not using accessory muscles, speaking in full sentences. Left Ankle: No visible erythema or swelling. Range of motion is full in all directions. Strength is 5/5 in all directions. Stable lateral and medial ligaments; squeeze test and kleiger test unremarkable; Talar dome nontender; No pain at base of 5th MT; No tenderness over cuboid; No tenderness over N spot or navicular prominence No tenderness on posterior aspects of lateral and medial malleolus No sign of peroneal tendon subluxations; Negative tarsal tunnel tinel's Able to walk 4 steps.  Impression and Recommendations:

## 2014-09-08 NOTE — Assessment & Plan Note (Signed)
Completely resolved, continue heel lift and go back into regular shoes. May return to work.

## 2014-09-17 ENCOUNTER — Other Ambulatory Visit: Payer: Self-pay | Admitting: *Deleted

## 2014-09-17 MED ORDER — MELOXICAM 15 MG PO TABS: 15.0000 mg | ORAL_TABLET | Freq: Every day | ORAL | Status: DC

## 2014-09-18 LAB — COMPREHENSIVE METABOLIC PANEL
AST: 24 U/L (ref 0–37)
Alkaline Phosphatase: 70 U/L (ref 39–117)
BUN: 26 mg/dL — ABNORMAL HIGH (ref 6–23)
Calcium: 9.2 mg/dL (ref 8.4–10.5)
Total Bilirubin: 0.6 mg/dL (ref 0.2–1.2)
Total Protein: 6.8 g/dL (ref 6.0–8.3)

## 2014-09-18 LAB — COMPREHENSIVE METABOLIC PANEL WITH GFR
ALT: 34 U/L (ref 0–53)
Albumin: 4.6 g/dL (ref 3.5–5.2)
CO2: 29 meq/L (ref 19–32)
Chloride: 101 meq/L (ref 96–112)
Creat: 1.24 mg/dL (ref 0.50–1.35)
Glucose, Bld: 85 mg/dL (ref 70–99)
Potassium: 4.3 meq/L (ref 3.5–5.3)
Sodium: 137 meq/L (ref 135–145)

## 2014-09-18 LAB — LIPID PANEL
Cholesterol: 117 mg/dL (ref 0–200)
HDL: 32 mg/dL — ABNORMAL LOW (ref >=40)
LDL Cholesterol: 49 mg/dL (ref 0–99)
Total CHOL/HDL Ratio: 3.7 Ratio
Triglycerides: 180 mg/dL — ABNORMAL HIGH (ref <150)
VLDL: 36 mg/dL (ref 0–40)

## 2014-09-18 LAB — LDL CHOLESTEROL, DIRECT: Direct LDL: 56 mg/dL

## 2014-10-22 ENCOUNTER — Ambulatory Visit (INDEPENDENT_AMBULATORY_CARE_PROVIDER_SITE_OTHER): Payer: 59 | Admitting: Sports Medicine

## 2014-10-22 ENCOUNTER — Encounter: Payer: Self-pay | Admitting: Sports Medicine

## 2014-10-22 VITALS — BP 107/70 | HR 77 | Wt 187.0 lb

## 2014-10-22 DIAGNOSIS — S93601A Unspecified sprain of right foot, initial encounter: Secondary | ICD-10-CM

## 2014-10-22 DIAGNOSIS — E669 Obesity, unspecified: Secondary | ICD-10-CM

## 2014-10-22 DIAGNOSIS — G8929 Other chronic pain: Secondary | ICD-10-CM

## 2014-10-22 DIAGNOSIS — M7712 Lateral epicondylitis, left elbow: Secondary | ICD-10-CM

## 2014-10-22 DIAGNOSIS — E785 Hyperlipidemia, unspecified: Secondary | ICD-10-CM | POA: Diagnosis not present

## 2014-10-22 DIAGNOSIS — M25571 Pain in right ankle and joints of right foot: Secondary | ICD-10-CM | POA: Diagnosis not present

## 2014-10-22 DIAGNOSIS — M7711 Lateral epicondylitis, right elbow: Secondary | ICD-10-CM

## 2014-10-22 MED ORDER — OXYCODONE-ACETAMINOPHEN 5-325 MG PO TABS: 1.0000 | ORAL_TABLET | Freq: Three times a day (TID) | ORAL | Status: DC | PRN

## 2014-10-22 MED ORDER — ICOSAPENT ETHYL 1 G PO CAPS: 1.0000 | ORAL_CAPSULE | Freq: Two times a day (BID) | ORAL | Status: DC

## 2014-10-22 MED ORDER — PHENTERMINE HCL 37.5 MG PO TABS: 18.7500 mg | ORAL_TABLET | Freq: Every day | ORAL | Status: DC

## 2014-10-22 MED ORDER — DICLOFENAC SODIUM 75 MG PO TBEC: 75.0000 mg | DELAYED_RELEASE_TABLET | Freq: Two times a day (BID) | ORAL | Status: DC

## 2014-10-22 NOTE — Assessment & Plan Note (Signed)
Continue counterforce bracing. Rehabilitation exercises given. Switching from Mobic to diclofenac.

## 2014-10-22 NOTE — Assessment & Plan Note (Signed)
Refilling pain medication

## 2014-10-22 NOTE — Progress Notes (Signed)
  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  Lateral epicondylitis: Moderate, persistent, bilateral, has a counterforce brace.  Obesity: Restarting phentermine per patient request.  Hyperlipidemia: Stable on pravastatin, he does have a bit more distance to go, triglycerides were 180. Amenable to try fish oil.  Past medical history, Surgical history, Family history not pertinant except as noted below, Social history, Allergies, and medications have been entered into the medical record, reviewed, and no changes needed.   Review of Systems: No fevers, chills, night sweats, weight loss, chest pain, or shortness of breath.   Objective:    General: Well Developed, well nourished, and in no acute distress.  Neuro: Alert and oriented x3, extra-ocular muscles intact, sensation grossly intact.  HEENT: Normocephalic, atraumatic, pupils equal round reactive to light, neck supple, no masses, no lymphadenopathy, thyroid nonpalpable.  Skin: Warm and dry, no rashes. Cardiac: Regular rate and rhythm, no murmurs rubs or gallops, no lower extremity edema.  Respiratory: Clear to auscultation bilaterally. Not using accessory muscles, speaking in full sentences.  Impression and Recommendations:

## 2014-10-22 NOTE — Assessment & Plan Note (Signed)
Restarting phentermine, one half tab daily.

## 2014-10-22 NOTE — Assessment & Plan Note (Signed)
Continue pravastatin. Adding Vascepa. Recheck triglycerides in 3 months.

## 2014-12-22 ENCOUNTER — Other Ambulatory Visit: Payer: Self-pay | Admitting: Sports Medicine

## 2015-01-21 ENCOUNTER — Ambulatory Visit (INDEPENDENT_AMBULATORY_CARE_PROVIDER_SITE_OTHER): Payer: 59 | Admitting: Sports Medicine

## 2015-01-21 ENCOUNTER — Encounter: Payer: Self-pay | Admitting: Sports Medicine

## 2015-01-21 VITALS — BP 111/68 | HR 86 | Wt 182.0 lb

## 2015-01-21 DIAGNOSIS — M67432 Ganglion, left wrist: Secondary | ICD-10-CM

## 2015-01-21 DIAGNOSIS — S93601A Unspecified sprain of right foot, initial encounter: Secondary | ICD-10-CM

## 2015-01-21 DIAGNOSIS — M7711 Lateral epicondylitis, right elbow: Secondary | ICD-10-CM

## 2015-01-21 DIAGNOSIS — G5602 Carpal tunnel syndrome, left upper limb: Secondary | ICD-10-CM | POA: Diagnosis not present

## 2015-01-21 DIAGNOSIS — E785 Hyperlipidemia, unspecified: Secondary | ICD-10-CM

## 2015-01-21 DIAGNOSIS — M7712 Lateral epicondylitis, left elbow: Secondary | ICD-10-CM | POA: Diagnosis not present

## 2015-01-21 DIAGNOSIS — E669 Obesity, unspecified: Secondary | ICD-10-CM | POA: Diagnosis not present

## 2015-01-21 MED ORDER — PHENTERMINE HCL 37.5 MG PO TABS: 18.7500 mg | ORAL_TABLET | Freq: Every day | ORAL | Status: DC

## 2015-01-21 MED ORDER — OXYCODONE-ACETAMINOPHEN 5-325 MG PO TABS: 1.0000 | ORAL_TABLET | Freq: Three times a day (TID) | ORAL | Status: DC | PRN

## 2015-01-21 NOTE — Assessment & Plan Note (Signed)
Has had right third trigger fingers. Today pain predominantly represent carpal tunnel syndrome, median nerve hydrodissection as above.

## 2015-01-21 NOTE — Assessment & Plan Note (Signed)
Good continued weight loss. We are going to do a single additional 3 months before stopping.

## 2015-01-21 NOTE — Progress Notes (Signed)
Subjective:    CC: Follow-up multiple issues  HPI: Bilateral tennis elbow: Has not yet responded to conservative measures  Hyper lipidemia: Needs a recheck, currently on fish oil as well as a statin.  Chronic pain: Needs a refill on oxycodone  Obesity: Good weight loss after 9 months, needs 3 more months of phentermine low-dose.  Left wrist pain: With numbness and tingling, moderate, persistent, radiation to the forearm and into the hand. Difficult and weak grip.  Past medical history, Surgical history, Family history not pertinant except as noted below, Social history, Allergies, and medications have been entered into the medical record, reviewed, and no changes needed.   Review of Systems: No fevers, chills, night sweats, weight loss, chest pain, or shortness of breath.   Objective:    General: Well Developed, well nourished, and in no acute distress.  Neuro: Alert and oriented x3, extra-ocular muscles intact, sensation grossly intact.  HEENT: Normocephalic, atraumatic, pupils equal round reactive to light, neck supple, no masses, no lymphadenopathy, thyroid nonpalpable.  Skin: Warm and dry, no rashes. Cardiac: Regular rate and rhythm, no murmurs rubs or gallops, no lower extremity edema.  Respiratory: Clear to auscultation bilaterally. Not using accessory muscles, speaking in full sentences. Left Wrist: Inspection normal with no visible erythema or swelling. ROM smooth and normal with good flexion and extension and ulnar/radial deviation that is symmetrical with opposite wrist. Palpation is normal over metacarpals, navicular, lunate, and TFCC; tendons without tenderness/ swelling No snuffbox tenderness. No tenderness over Canal of Guyon. Strength 5/5 in all directions without pain. Positive tinel's and phalens. Negative Watson's test.  Procedure: Real-time Ultrasound Guided Injection of right common extensor tendon origin Device: GE Logiq E  Verbal informed consent  obtained.  Time-out conducted.  Noted no overlying erythema, induration, or other signs of local infection.  Skin prepped in a sterile fashion.  Local anesthesia: Topical Ethyl chloride.  With sterile technique and under real time ultrasound guidance:  A total of 1 mL kenalog 40, 4 mL lidocaine injected both superficial to and deep, extensor tendon at it's origin on the lateral epicondyles Completed without difficulty  Pain immediately resolved suggesting accurate placement of the medication.  Advised to call if fevers/chills, erythema, induration, drainage, or persistent bleeding.  Images permanently stored and available for review in the ultrasound unit.  Impression: Technically successful ultrasound guided injection.  Procedure: Real-time Ultrasound Guided Injection of left common extensor tendon origin Device: GE Logiq E  Verbal informed consent obtained.  Time-out conducted.  Noted no overlying erythema, induration, or other signs of local infection.  Skin prepped in a sterile fashion.  Local anesthesia: Topical Ethyl chloride.  With sterile technique and under real time ultrasound guidance:  A total of 1 mL kenalog 40, 4 mL lidocaine injected both superficial to and deep, extensor tendon at it's origin on the lateral epicondyles Completed without difficulty  Pain immediately resolved suggesting accurate placement of the medication.  Advised to call if fevers/chills, erythema, induration, drainage, or persistent bleeding.  Images permanently stored and available for review in the ultrasound unit.  Impression: Technically successful ultrasound guided injection.  Procedure: Real-time Ultrasound Guided Injection of left median nerve hydrodissection Device: GE Logiq E  Verbal informed consent obtained.  Time-out conducted.  Noted no overlying erythema, induration, or other signs of local infection.  Skin prepped in a sterile fashion.  Local anesthesia: Topical Ethyl chloride.  With  sterile technique and under real time ultrasound guidance:  A total of 1 mL kenalog 40,  4 mL lidocaine injected both superficial to and deep to the median nerve in the carpal tunnel freeing it from strength, medication was also redirected deep within the tunnel around the tendons. Completed without difficulty  Pain immediately resolved suggesting accurate placement of the medication.  Advised to call if fevers/chills, erythema, induration, drainage, or persistent bleeding.  Images permanently stored and available for review in the ultrasound unit.  Impression: Technically successful ultrasound guided injection.  Impression and Recommendations:   I spent 40 minutes with this patient, greater than 50% was face-to-face time counseling regarding the above diagnoses, time spent was separate from procedural time above.

## 2015-01-21 NOTE — Assessment & Plan Note (Signed)
Bilateral common extensor tendon injections.

## 2015-01-21 NOTE — Assessment & Plan Note (Signed)
Predominantly hypertriglyceridemia at this point, continue omega-3 oil. Rechecking fasting lipids.

## 2015-01-21 NOTE — Assessment & Plan Note (Signed)
Left-sided, asymptomatic, no further management right now.

## 2015-02-24 ENCOUNTER — Encounter: Payer: Self-pay | Admitting: Sports Medicine

## 2015-02-24 ENCOUNTER — Ambulatory Visit (INDEPENDENT_AMBULATORY_CARE_PROVIDER_SITE_OTHER): Payer: 59 | Admitting: Sports Medicine

## 2015-02-24 VITALS — BP 96/62 | HR 78 | Ht 69.0 in | Wt 175.0 lb

## 2015-02-24 DIAGNOSIS — I10 Essential (primary) hypertension: Secondary | ICD-10-CM

## 2015-02-24 DIAGNOSIS — M7711 Lateral epicondylitis, right elbow: Secondary | ICD-10-CM | POA: Diagnosis not present

## 2015-02-24 DIAGNOSIS — M5136 Other intervertebral disc degeneration, lumbar region with discogenic back pain only: Secondary | ICD-10-CM

## 2015-02-24 DIAGNOSIS — M7712 Lateral epicondylitis, left elbow: Secondary | ICD-10-CM

## 2015-02-24 DIAGNOSIS — G5602 Carpal tunnel syndrome, left upper limb: Secondary | ICD-10-CM

## 2015-02-24 DIAGNOSIS — M545 Low back pain: Secondary | ICD-10-CM

## 2015-02-24 MED ORDER — MAGNESIUM OXIDE 400 MG PO TABS: 800.0000 mg | ORAL_TABLET | Freq: Every day | ORAL | Status: DC

## 2015-02-24 MED ORDER — LISINOPRIL 20 MG PO TABS: 10.0000 mg | ORAL_TABLET | Freq: Every day | ORAL | Status: DC

## 2015-02-24 NOTE — Assessment & Plan Note (Addendum)
Now having a recurrence, advised to  Continue with home rehabilitation exercises before considering formal physical therapy.

## 2015-02-24 NOTE — Assessment & Plan Note (Signed)
Resolved after bilateral common extensor percutaneous tenotomy.

## 2015-02-24 NOTE — Assessment & Plan Note (Signed)
Resolved after median nerve hydrodissection at the last visit 

## 2015-02-24 NOTE — Progress Notes (Signed)
  Subjective:    CC: follow-up  HPI: Bilateral tennis elbow: Resolved after bilateral percutaneous needle tenotomy.  Left carpal tunnel syndrome: Resolved after left median nerve hydrodissection of the last visit  Cramping: Also with an element of orthostatic hypotension, has been outside, sweating, also taking his lisinopril/Hydrochlorothiazide, at this point he is a bit dehydrated and  Past medical history, Surgical history, Family history not pertinant except as noted below, Social history, Allergies, and medications have been entered into the medical record, reviewed, and no changes needed.   Review of Systems: No fevers, chills, night sweats, weight loss, chest pain, or shortness of breath.   Objective:    General: Well Developed, well nourished, and in no acute distress.  Neuro: Alert and oriented x3, extra-ocular muscles intact, sensation grossly intact.  HEENT: Normocephalic, atraumatic, pupils equal round reactive to light, neck supple, no masses, no lymphadenopathy, thyroid nonpalpable.  Skin: Warm and dry, no rashes. Cardiac: Regular rate and rhythm, no murmurs rubs or gallops, no lower extremity edema.  Respiratory: Clear to auscultation bilaterally. Not using accessory muscles, speaking in full sentences.  Impression and Recommendations:

## 2015-02-24 NOTE — Assessment & Plan Note (Signed)
Slightly low, has since discontinued his blood pressure medication. He is getting some cramping.  Likely dehydrated and hypomagnesemic, discontinue hydrochlorothiazide and continue with simply lisinopril 20, hydrate with  electrolyte-containing solution, and adding magnesium at that time.

## 2015-04-29 ENCOUNTER — Ambulatory Visit (INDEPENDENT_AMBULATORY_CARE_PROVIDER_SITE_OTHER): Payer: 59 | Admitting: Sports Medicine

## 2015-04-29 DIAGNOSIS — M109 Gout, unspecified: Secondary | ICD-10-CM

## 2015-04-29 DIAGNOSIS — M1000 Idiopathic gout, unspecified site: Secondary | ICD-10-CM | POA: Diagnosis not present

## 2015-04-29 NOTE — Progress Notes (Signed)
  Subjective:    CC: Left great toe swelling  HPI: This is a pleasant 44 year old male, no history of gout, is the second time he had an episode of left great toe swelling, it occurred over a single day, he did have a couple of days of eating a lot of pork. No alcohol consumption. Pain is severe, persistent with erythema localized over the first MTP.  Past medical history, Surgical history, Family history not pertinant except as noted below, Social history, Allergies, and medications have been entered into the medical record, reviewed, and no changes needed.   Review of Systems: No fevers, chills, night sweats, weight loss, chest pain, or shortness of breath.   Objective:    General: Well Developed, well nourished, and in no acute distress.  Neuro: Alert and oriented x3, extra-ocular muscles intact, sensation grossly intact.  HEENT: Normocephalic, atraumatic, pupils equal round reactive to light, neck supple, no masses, no lymphadenopathy, thyroid nonpalpable.  Skin: Warm and dry, no rashes. Cardiac: Regular rate and rhythm, no murmurs rubs or gallops, no lower extremity edema.  Respiratory: Clear to auscultation bilaterally. Not using accessory muscles, speaking in full sentences. Left Foot: Visibly swollen and erythematous over the left first metatarsophalangeal joint. Range of motion is full in all directions. Strength is 5/5 in all directions. No hallux valgus. No pes cavus or pes planus. No abnormal callus noted. No pain over the navicular prominence, or base of fifth metatarsal. No tenderness to palpation of the calcaneal insertion of plantar fascia. No pain at the Achilles insertion. No pain over the calcaneal bursa. No pain of the retrocalcaneal bursa. No tenderness to palpation over the tarsals, metatarsals, or phalanges. No hallux rigidus or limitus. No tenderness palpation over interphalangeal joints. No pain with compression of the metatarsal heads. Neurovascularly  intact distally.  Procedure: Real-time Ultrasound Guided aspiration/Injection of left first MTP Device: GE Logiq E  Verbal informed consent obtained.  Time-out conducted.  Noted no overlying erythema, induration, or other signs of local infection.  Skin prepped in a sterile fashion.  Local anesthesia: Topical Ethyl chloride.  With sterile technique and under real time ultrasound guidance:  Aspirated a fraction of a milliliter of cloudy, thick yellowish fluid from the MTP, syringe switched and 1/2 mL Kenalog 40, 1/2 mL lidocaine injected easily. Completed without difficulty  Pain immediately resolved suggesting accurate placement of the medication.  Advised to call if fevers/chills, erythema, induration, drainage, or persistent bleeding.  Images permanently stored and available for review in the ultrasound unit.  Impression: Technically successful ultrasound guided injection.  Impression and Recommendations:

## 2015-04-29 NOTE — Assessment & Plan Note (Signed)
Recurrence of a left MTP first swelling, aspiration and injection as above.  Previously crystal analysis was negative, this does resemble acute gout. Checking another crystal analysis

## 2015-04-30 LAB — SYNOVIAL CELL COUNT + DIFF, W/ CRYSTALS
Eosinophils-Synovial: 0 % (ref 0–1)
Lymphocytes-Synovial Fld: 4 % (ref 0–20)
Monocyte/Macrophage: 13 % — ABNORMAL LOW (ref 50–90)
Neutrophil, Synovial: 84 % — ABNORMAL HIGH (ref 0–25)
WBC, Synovial: 14600 uL — ABNORMAL HIGH (ref 0–200)

## 2015-05-27 ENCOUNTER — Encounter: Payer: Self-pay | Admitting: Sports Medicine

## 2015-05-27 ENCOUNTER — Other Ambulatory Visit: Payer: Self-pay | Admitting: Sports Medicine

## 2015-05-27 ENCOUNTER — Ambulatory Visit (INDEPENDENT_AMBULATORY_CARE_PROVIDER_SITE_OTHER): Payer: 59 | Admitting: Sports Medicine

## 2015-05-27 ENCOUNTER — Ambulatory Visit: Payer: 59 | Admitting: Sports Medicine

## 2015-05-27 VITALS — BP 138/87 | HR 67 | Temp 97.9°F | Resp 18 | Wt 189.0 lb

## 2015-05-27 DIAGNOSIS — M10072 Idiopathic gout, left ankle and foot: Secondary | ICD-10-CM

## 2015-05-27 DIAGNOSIS — E669 Obesity, unspecified: Secondary | ICD-10-CM | POA: Diagnosis not present

## 2015-05-27 DIAGNOSIS — S93601A Unspecified sprain of right foot, initial encounter: Secondary | ICD-10-CM

## 2015-05-27 DIAGNOSIS — E039 Hypothyroidism, unspecified: Secondary | ICD-10-CM

## 2015-05-27 DIAGNOSIS — E785 Hyperlipidemia, unspecified: Secondary | ICD-10-CM

## 2015-05-27 DIAGNOSIS — I10 Essential (primary) hypertension: Secondary | ICD-10-CM

## 2015-05-27 LAB — LIPID PANEL
Cholesterol: 150 mg/dL (ref 125–200)
HDL: 36 mg/dL — ABNORMAL LOW (ref >=40)
LDL Cholesterol: 81 mg/dL (ref <130)
Total CHOL/HDL Ratio: 4.2 Ratio (ref <=5.0)
Triglycerides: 166 mg/dL — ABNORMAL HIGH (ref <150)
VLDL: 33 mg/dL — ABNORMAL HIGH (ref <30)

## 2015-05-27 LAB — COMPLETE METABOLIC PANEL WITHOUT GFR
ALT: 30 U/L (ref 9–46)
AST: 24 U/L (ref 10–40)
Albumin: 4.4 g/dL (ref 3.6–5.1)
Alkaline Phosphatase: 73 U/L (ref 40–115)
CO2: 29 mmol/L (ref 20–31)
GFR, Est African American: 80 mL/min (ref >=60)
GFR, Est Non African American: 69 mL/min (ref >=60)
Glucose, Bld: 91 mg/dL (ref 65–99)
Potassium: 4.6 mmol/L (ref 3.5–5.3)
Total Bilirubin: 0.6 mg/dL (ref 0.2–1.2)
Total Protein: 6.9 g/dL (ref 6.1–8.1)

## 2015-05-27 LAB — COMPLETE METABOLIC PANEL WITH GFR
BUN: 12 mg/dL (ref 7–25)
Calcium: 9.6 mg/dL (ref 8.6–10.3)
Chloride: 104 mmol/L (ref 98–110)
Creat: 1.25 mg/dL (ref 0.60–1.35)
Sodium: 142 mmol/L (ref 135–146)

## 2015-05-27 MED ORDER — PRAVASTATIN SODIUM 40 MG PO TABS: 40.0000 mg | ORAL_TABLET | Freq: Every day | ORAL | Status: DC

## 2015-05-27 MED ORDER — LEVOTHYROXINE SODIUM 50 MCG PO TABS: 50.0000 ug | ORAL_TABLET | Freq: Every day | ORAL | Status: DC

## 2015-05-27 MED ORDER — OXYCODONE-ACETAMINOPHEN 5-325 MG PO TABS: 1.0000 | ORAL_TABLET | Freq: Three times a day (TID) | ORAL | Status: DC | PRN

## 2015-05-27 MED ORDER — PHENTERMINE HCL 37.5 MG PO TABS: 18.7500 mg | ORAL_TABLET | Freq: Every day | ORAL | Status: DC

## 2015-05-27 NOTE — Progress Notes (Signed)
  Subjective:    CC: Follow-up  HPI: Gout: Awaiting uric acid levels, podagra flare resolved after aspiration and injection, with crystal analysis positive for intracellular and extracellular uric acid crystals.  Obesity: In the final 3 months of phentermine, agreeable to finally treat his thyroid.  Hyperlipidemia: Stable, needs a refill on pravastatin  Hypothyroidism: Has previously been resistant to treatment however he is currently noticing a bit of absentmindedness and difficulty losing weight. TSHs have been anywhere from 25-30.  Past medical history, Surgical history, Family history not pertinant except as noted below, Social history, Allergies, and medications have been entered into the medical record, reviewed, and no changes needed.   Review of Systems: No fevers, chills, night sweats, weight loss, chest pain, or shortness of breath.   Objective:    General: Well Developed, well nourished, and in no acute distress.  Neuro: Alert and oriented x3, extra-ocular muscles intact, sensation grossly intact.  HEENT: Normocephalic, atraumatic, pupils equal round reactive to light, neck supple, no masses, no lymphadenopathy, thyroid nonpalpable.  Skin: Warm and dry, no rashes. Cardiac: Regular rate and rhythm, no murmurs rubs or gallops, no lower extremity edema.  Respiratory: Clear to auscultation bilaterally. Not using accessory muscles, speaking in full sentences.  Impression and Recommendations:

## 2015-05-27 NOTE — Assessment & Plan Note (Signed)
Awaiting uric acid levels. Podagra resolved after aspiration and injection.

## 2015-05-27 NOTE — Assessment & Plan Note (Signed)
Patient finally agrees to let me treat his hypothyroidism, he is now experiencing forgetfulness and absentmindedness. Also difficulty losing weight. Starting levothyroxine 50 g, recheck TSH in 6 weeks.

## 2015-05-27 NOTE — Assessment & Plan Note (Signed)
Continue pravastatin, refilling.

## 2015-05-27 NOTE — Assessment & Plan Note (Signed)
Refilling phentermine for 3 additional months then stopping. Additional levothyroxine also help him with weight loss.

## 2015-05-28 LAB — URIC ACID: Uric Acid, Serum: 6.5 mg/dL (ref 4.0–7.8)

## 2015-05-30 ENCOUNTER — Other Ambulatory Visit: Payer: Self-pay

## 2015-05-30 DIAGNOSIS — M10072 Idiopathic gout, left ankle and foot: Secondary | ICD-10-CM

## 2015-05-30 DIAGNOSIS — K120 Recurrent oral aphthae: Secondary | ICD-10-CM

## 2015-05-30 LAB — TSH: TSH: 18.794 u[IU]/mL — ABNORMAL HIGH (ref 0.350–4.500)

## 2015-06-01 MED ORDER — TRIAMCINOLONE ACETONIDE 0.1 % MT PSTE: 1.0000 "application " | PASTE | Freq: Two times a day (BID) | OROMUCOSAL | Status: DC

## 2015-06-01 MED ORDER — ALLOPURINOL 300 MG PO TABS: 300.0000 mg | ORAL_TABLET | Freq: Every day | ORAL | Status: DC

## 2015-06-01 NOTE — Assessment & Plan Note (Signed)
Uric acid levels are high, he did have an episode of podagra and diagnosis was confirmed with aspiration and injection as well as crystal analysis. Adding allopurinol. Recheck in 4 weeks.

## 2015-06-21 ENCOUNTER — Other Ambulatory Visit: Payer: Self-pay | Admitting: Nurse Practitioner

## 2015-06-21 ENCOUNTER — Ambulatory Visit
Admission: RE | Admit: 2015-06-21 | Discharge: 2015-06-21 | Disposition: A | Discharge status: Home / Self Care | Payer: Worker's Compensation | Source: Ambulatory Visit | Attending: Nurse Practitioner | Admitting: Nurse Practitioner

## 2015-06-21 DIAGNOSIS — R609 Edema, unspecified: Secondary | ICD-10-CM

## 2015-06-21 DIAGNOSIS — R52 Pain, unspecified: Secondary | ICD-10-CM

## 2015-06-23 ENCOUNTER — Ambulatory Visit (INDEPENDENT_AMBULATORY_CARE_PROVIDER_SITE_OTHER): Payer: Commercial Managed Care - HMO | Admitting: Sports Medicine

## 2015-06-23 ENCOUNTER — Encounter: Payer: Self-pay | Admitting: Sports Medicine

## 2015-06-23 DIAGNOSIS — M10039 Idiopathic gout, unspecified wrist: Secondary | ICD-10-CM

## 2015-06-23 DIAGNOSIS — G5602 Carpal tunnel syndrome, left upper limb: Secondary | ICD-10-CM | POA: Diagnosis not present

## 2015-06-23 DIAGNOSIS — M109 Gout, unspecified: Secondary | ICD-10-CM

## 2015-06-23 NOTE — Progress Notes (Signed)
  Subjective:    CC: Left wrist pain  HPI: Patient presents with left wrist pain that began 2 days ago. He reports being at work and drilling a hole when it caught and twisted his wrist. He says that he went to an urgent care the day of and they told him that it wasn't broken and that he sprained it. He is here for a second opinions.  He says that the pain is mainly near the base of his thumb and extends over the back of his wrist. He says that he thinks it is swollen and moving it in any direction causes pain. He is unsure if the wrist is weak or not as he has not been moving it, however the fingers have full ROM and do not seem weak. He denies numbness or tingling. He has never had anything like this before.  Past medical history, Surgical history, Family history not pertinant except as noted below, Social history, Allergies, and medications have been entered into the medical record, reviewed, and no changes needed.   Review of Systems: No fevers, chills, night sweats, weight loss, chest pain, or shortness of breath.   Objective:    General: Well Developed, well nourished, and in no acute distress.  Neuro: Alert and oriented x3, extra-ocular muscles intact, sensation grossly intact.  HEENT: Normocephalic, atraumatic, pupils equal round reactive to light, neck supple, no masses, no lymphadenopathy, thyroid nonpalpable.  Skin: Warm and dry, no rashes. Cardiac: Regular rate and rhythm, no murmurs rubs or gallops, no lower extremity edema.  Respiratory: Clear to auscultation bilaterally. Not using accessory muscles, speaking in full sentences. Left Wrist: Inspection shows a swollen wrist with mild erythema over the dorsal surface. The dorsum of the wrist feels warm to touch. Active ROM is pain limited. Passive ROM induces pain in all directions. Palpation elicits mild tenderness over the metacarpals, navicular, lunate, and TFCC; Severe tenderness of the extensor pollicis brevis. No snuffbox  tenderness. No tenderness over Canal of Guyon. Strength not tested due to pain limitation. Negative Finkelstein, tinel's and phalens. Positive Eichhoff's test Negative Watson's test.  Procedure: Real-time Ultrasound Guided aspiration/Injection of left wrist Device: GE Logiq E  Verbal informed consent obtained.  Time-out conducted.  Noted no overlying erythema, induration, or other signs of local infection.  Skin prepped in a sterile fashion.  Local anesthesia: Topical Ethyl chloride.  With sterile technique and under real time ultrasound guidance:  Noted moderate effusion in the radiocarpal joint, 22-gauge needle advanced into the effusion and 1 mL of cloudy fluid was aspirated, syringe switched and 1 mL kenalog 40, 1 mL lidocaine injected easily. Completed without difficulty  Pain immediately resolved suggesting accurate placement of the medication.  Advised to call if fevers/chills, erythema, induration, drainage, or persistent bleeding.  Images permanently stored and available for review in the ultrasound unit.  Impression: Technically successful ultrasound guided injection.   The wrist was then strapped with compressive dressing.  Impression and Recommendations:    X-rays reviewed and were negative for a fracture.  Patient's injury most likely caused tendinitis of extensor pollicis brevis. Wrist swelling and warmth is concerning for a gout flare, possibly secondary to recent trauma, given his history and the warmth from the joint. A septic joint is less likely due to the lack of fever and open wound. Will drain fluid off of wrist joint and send for fluid analysis. Will also do steroid injection to quell the inflammation.

## 2015-06-23 NOTE — Assessment & Plan Note (Signed)
Left wrist aspirated and injected. Uric acid levels have been normalized in the past. Return to see me in one month. Crystal analysis for fluid aspirate.

## 2015-06-24 LAB — SYNOVIAL CELL COUNT + DIFF, W/ CRYSTALS
Eosinophils-Synovial: 1 % (ref 0–1)
Lymphocytes-Synovial Fld: 9 % (ref 0–20)
Monocyte/Macrophage: 8 % — ABNORMAL LOW (ref 50–90)
Neutrophil, Synovial: 82 % — ABNORMAL HIGH (ref 0–25)
WBC, Synovial: 42000 cu mm — ABNORMAL HIGH (ref 0–200)

## 2015-07-08 ENCOUNTER — Ambulatory Visit (INDEPENDENT_AMBULATORY_CARE_PROVIDER_SITE_OTHER): Payer: Commercial Managed Care - HMO | Admitting: Sports Medicine

## 2015-07-08 ENCOUNTER — Encounter: Payer: Self-pay | Admitting: Sports Medicine

## 2015-07-08 VITALS — BP 138/92 | HR 82 | Temp 98.2°F | Resp 18 | Wt 183.9 lb

## 2015-07-08 DIAGNOSIS — M7711 Lateral epicondylitis, right elbow: Secondary | ICD-10-CM | POA: Diagnosis not present

## 2015-07-08 DIAGNOSIS — M7712 Lateral epicondylitis, left elbow: Secondary | ICD-10-CM | POA: Diagnosis not present

## 2015-07-08 DIAGNOSIS — M10039 Idiopathic gout, unspecified wrist: Secondary | ICD-10-CM

## 2015-07-08 DIAGNOSIS — E039 Hypothyroidism, unspecified: Secondary | ICD-10-CM

## 2015-07-08 DIAGNOSIS — M109 Gout, unspecified: Secondary | ICD-10-CM

## 2015-07-08 NOTE — Assessment & Plan Note (Signed)
Continue allopurinol, rechecking uric acid levels

## 2015-07-08 NOTE — Assessment & Plan Note (Signed)
Mild recurrence, right-sided, restart rehabilitation exercises for now.

## 2015-07-08 NOTE — Progress Notes (Signed)
  Subjective:    CC: Follow-up  HPI: Hypothyroidism: Due for a TSH recheck.  Gout: Resolved after wrist injection and aspiration and previous visit, due for recheck of uric acid levels, currently on allopurinol.  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 Agreeable to start with simple rehabilitation exercises again.  Past medical history, Surgical history, Family history not pertinant except as noted below, Social history, Allergies, and medications have been entered into the medical record, reviewed, and no changes needed.   Review of Systems: No fevers, chills, night sweats, weight loss, chest pain, or shortness of breath.   Objective:    General: Well Developed, well nourished, and in no acute distress.  Neuro: Alert and oriented x3, extra-ocular muscles intact, sensation grossly intact.  HEENT: Normocephalic, atraumatic, pupils equal round reactive to light, neck supple, no masses, no lymphadenopathy, thyroid nonpalpable.  Skin: Warm and dry, no rashes. Cardiac: Regular rate and rhythm, no murmurs rubs or gallops, no lower extremity edema.  Respiratory: Clear to auscultation bilaterally. Not using accessory muscles, speaking in full sentences. Right Elbow: Unremarkable to inspection. Range of motion full pronation, supination, flexion, extension. Strength is full to all of the above directions Stable to varus, valgus stress. Negative moving valgus stress test. Only minimal tenderness at the common extensor tendon origin. Ulnar nerve does not sublux. Negative cubital tunnel Tinel's.  Impression and Recommendations:

## 2015-07-08 NOTE — Assessment & Plan Note (Signed)
Rechecking TSH 

## 2015-07-09 LAB — TSH: TSH: 9.264 u[IU]/mL — ABNORMAL HIGH (ref 0.350–4.500)

## 2015-07-09 LAB — URIC ACID: Uric Acid, Serum: 3.9 mg/dL — ABNORMAL LOW (ref 4.0–7.8)

## 2015-07-11 ENCOUNTER — Other Ambulatory Visit: Payer: Self-pay

## 2015-07-11 DIAGNOSIS — E785 Hyperlipidemia, unspecified: Secondary | ICD-10-CM

## 2015-07-11 MED ORDER — ICOSAPENT ETHYL 1 G PO CAPS: 1.0000 | ORAL_CAPSULE | Freq: Two times a day (BID) | ORAL | Status: DC

## 2015-07-11 MED ORDER — LEVOTHYROXINE SODIUM 88 MCG PO TABS: 88.0000 ug | ORAL_TABLET | Freq: Every day | ORAL | Status: DC

## 2015-07-11 NOTE — Addendum Note (Signed)
Addended by: Monica Becton on: 07/11/2015 08:38 AM   Modules accepted: Orders

## 2015-08-18 ENCOUNTER — Other Ambulatory Visit: Payer: Self-pay | Admitting: Sports Medicine

## 2015-08-18 ENCOUNTER — Telehealth: Payer: Self-pay | Admitting: Sports Medicine

## 2015-08-18 DIAGNOSIS — S93601A Unspecified sprain of right foot, initial encounter: Secondary | ICD-10-CM

## 2015-08-18 MED ORDER — OXYCODONE-ACETAMINOPHEN 5-325 MG PO TABS: 1.0000 | ORAL_TABLET | Freq: Three times a day (TID) | ORAL | Status: DC | PRN

## 2015-08-18 NOTE — Telephone Encounter (Signed)
Can you write his Prescription for Oxycodone that he gets about every 2-3 months and he will pick up here in the office. Let me know when its ready and I will let Patient know. Thanks so much

## 2015-08-18 NOTE — Telephone Encounter (Signed)
Rx in box. 

## 2015-09-06 ENCOUNTER — Ambulatory Visit (INDEPENDENT_AMBULATORY_CARE_PROVIDER_SITE_OTHER): Payer: Commercial Managed Care - HMO | Admitting: Sports Medicine

## 2015-09-06 ENCOUNTER — Encounter: Payer: Self-pay | Admitting: Sports Medicine

## 2015-09-06 VITALS — BP 119/79 | HR 76 | Resp 16 | Wt 190.3 lb

## 2015-09-06 DIAGNOSIS — M10039 Idiopathic gout, unspecified wrist: Secondary | ICD-10-CM

## 2015-09-06 DIAGNOSIS — E669 Obesity, unspecified: Secondary | ICD-10-CM | POA: Diagnosis not present

## 2015-09-06 DIAGNOSIS — M109 Gout, unspecified: Secondary | ICD-10-CM

## 2015-09-06 DIAGNOSIS — G250 Essential tremor: Secondary | ICD-10-CM | POA: Diagnosis not present

## 2015-09-06 MED ORDER — TOPIRAMATE 50 MG PO TABS: ORAL_TABLET | ORAL | Status: DC

## 2015-09-06 MED ORDER — COLCHICINE 0.6 MG PO TABS: 0.6000 mg | ORAL_TABLET | Freq: Every day | ORAL | Status: DC

## 2015-09-06 NOTE — Assessment & Plan Note (Signed)
Having recurrence of pain in the wrist, previous injection was 2 and half months ago. We probably need to add daily colchicine. Uric acid levels were down to 3.

## 2015-09-06 NOTE — Assessment & Plan Note (Signed)
Mild and does not interfere with activities of daily living, no treatment needed.

## 2015-09-06 NOTE — Progress Notes (Signed)
  Subjective:    CC: left wrist pain  HPI: This is a pleasant 45 year old male with history of gout, he is having new onset swelling and pain in his left wrist, without paresthesias into the hand. Symptoms are moderate, persistent without radiation, it is 2.5 months since his last wrist joint injection and last uric acid levels were in the threes.  Past medical history, Surgical history, Family history not pertinant except as noted below, Social history, Allergies, and medications have been entered into the medical record, reviewed, and no changes needed.   Review of Systems: No fevers, chills, night sweats, weight loss, chest pain, or shortness of breath.   Objective:    General: Well Developed, well nourished, and in no acute distress.  Neuro: Alert and oriented x3, extra-ocular muscles intact, sensation grossly intact.  HEENT: Normocephalic, atraumatic, pupils equal round reactive to light, neck supple, no masses, no lymphadenopathy, thyroid nonpalpable.  Skin: Warm and dry, no rashes. Cardiac: Regular rate and rhythm, no murmurs rubs or gallops, no lower extremity edema.  Respiratory: Clear to auscultation bilaterally. Not using accessory muscles, speaking in full sentences. Left wrist: Visibly swollen, palpable fluid wave.negative Tinel's sign, tender to palpation dorsally over the radiocarpal joint.  Procedure: Real-time Ultrasound Guided Injection of left wrist Device: GE Logiq E  Verbal informed consent obtained.  Time-out conducted.  Noted no overlying erythema, induration, or other signs of local infection.  Skin prepped in a sterile fashion.  Local anesthesia: Topical Ethyl chloride.  With sterile technique and under real time ultrasound guidance:  Noted visible osteoarthritis, 25-gauge needle advanced into the radiocarpal joint and 0.5 mL kenalog 40, 1 mL lidocaine injected easily. Minimal effusion was present. Completed without difficulty  Pain immediately resolved  suggesting accurate placement of the medication.  Advised to call if fevers/chills, erythema, induration, drainage, or persistent bleeding.  Images permanently stored and available for review in the ultrasound unit.  Impression: Technically successful ultrasound guided injection.  Impression and Recommendations:    I spent 40 minutes with this patient, greater than 50% was face-to-face time counseling regarding the above diagnoses, this time was separate from the time spent performing the above procedure

## 2015-09-06 NOTE — Assessment & Plan Note (Signed)
Adding Topamax.

## 2015-09-06 NOTE — Patient Instructions (Signed)
Essential Tremor A tremor is trembling or shaking that you cannot control. Most tremors affect the hands or arms. Tremors can also affect the head, vocal cords, face, and other parts of the body.  Essential tremor is a tremor without a known cause.  CAUSES Essential tremor has no known cause.  RISK FACTORS You may be at greater risk of essential tremor if:   You have a family member with essential tremor.   You are age 46 or older.   You take certain medicines. SIGNS AND SYMPTOMS The main sign of a tremor is uncontrolled and unintentional rhythmic shaking of a body part.  You may have difficulty eating with a spoon or fork.   You may have difficulty writing.   You may nod your head up and down or side to side.   You may have a quivering voice.  Your tremors:  May get worse over time.   May come and go.   May be more noticeable on one side of your body.   May get worse due to stress, fatigue, caffeine, and extreme heat or cold.  DIAGNOSIS Your health care provider can diagnose essential tremor based on your symptoms, medical history, and a physical examination. There is no single test to diagnose an essential tremor. However, your health care provider may perform a variety of tests to rule out other conditions. Tests may include:   Blood and urine tests.   Imaging studies of your brain, such as:   CT scan.   MRI.   A test that measures involuntary muscle movement (electromyogram). TREATMENT Your tremors may go away without treatment. Mild tremors may not need treatment if they do not affect your day-to-day life. Severe tremors may need to be treated using one or a combination of the following options:   Medicines. This may include medicine that is injected.  Lifestyle changes.   Physical therapy.  HOME CARE INSTRUCTIONS  Take medicines only as directed by your health care provider.   Limit alcohol intake to no more than 1 drink per day for  nonpregnant women and 2 drinks per day for men. One drink equals 12 oz of beer, 5 oz of wine, or 1 oz of hard liquor.  Do not use any tobacco products, including cigarettes, chewing tobacco, or electronic cigarettes. If you need help quitting, ask your health care provider.  Take medicines only as directed by your health care provider.   Avoid extreme heat or cold.   Limit the amount of caffeine you consumeas directed by your health care provider.   Try to get eight hours of sleep each night.  Find ways to manage your stress, such as meditation or yoga.  Keep all follow-up visits as directed by your health care provider. This is important. This includes any physical therapy visits. SEEK MEDICAL CARE IF:  You experience any changes in the location or intensity of your tremors.   You start having a tremor after starting a new medicine.   You have tremor with other symptoms such as:   Numbness.   Tingling.   Pain.   Weakness.   Your tremor gets worse.   Your tremor interferes with your daily life.    This information is not intended to replace advice given to you by your health care provider. Make sure you discuss any questions you have with your health care provider.   Document Released: 06/25/2014 Document Reviewed: 06/25/2014 Elsevier Interactive Patient Education 2016 [employer]  

## 2015-10-31 ENCOUNTER — Other Ambulatory Visit: Payer: Self-pay | Admitting: Sports Medicine

## 2015-11-16 ENCOUNTER — Encounter: Payer: Self-pay | Admitting: Sports Medicine

## 2015-11-16 ENCOUNTER — Ambulatory Visit (INDEPENDENT_AMBULATORY_CARE_PROVIDER_SITE_OTHER): Payer: Commercial Managed Care - HMO | Admitting: Sports Medicine

## 2015-11-16 VITALS — BP 100/63 | HR 63 | Resp 16 | Wt 193.8 lb

## 2015-11-16 DIAGNOSIS — E039 Hypothyroidism, unspecified: Secondary | ICD-10-CM

## 2015-11-16 DIAGNOSIS — E669 Obesity, unspecified: Secondary | ICD-10-CM

## 2015-11-16 DIAGNOSIS — S93601A Unspecified sprain of right foot, initial encounter: Secondary | ICD-10-CM | POA: Diagnosis not present

## 2015-11-16 DIAGNOSIS — M7711 Lateral epicondylitis, right elbow: Secondary | ICD-10-CM

## 2015-11-16 DIAGNOSIS — M7712 Lateral epicondylitis, left elbow: Secondary | ICD-10-CM

## 2015-11-16 LAB — TSH: TSH: 0.9 mIU/L (ref 0.40–4.50)

## 2015-11-16 MED ORDER — PHENTERMINE HCL 37.5 MG PO TABS: ORAL_TABLET | ORAL | Status: DC

## 2015-11-16 MED ORDER — OXYCODONE-ACETAMINOPHEN 5-325 MG PO TABS: 1.0000 | ORAL_TABLET | Freq: Three times a day (TID) | ORAL | Status: DC | PRN

## 2015-11-16 NOTE — Assessment & Plan Note (Signed)
Due to recheck TSH since increased to 88 micrograms

## 2015-11-16 NOTE — Assessment & Plan Note (Signed)
2 months of phentermine given, return in 2 months, at that point we will restart Topamax.

## 2015-11-16 NOTE — Addendum Note (Signed)
Addended by: Monica BectonHEKKEKANDAM, Calandria Mullings J on: 11/16/2015 10:33 AM   Modules accepted: Level of Service

## 2015-11-16 NOTE — Progress Notes (Addendum)
  Subjective:    CC: recurrent left wrist pain  HPI: History of gout attack in the left wrist as well as osteoarthritis, pain 2 days predominantly over the lateral epicondyle, known lateral epicondylitis. Pain is moderate, persistent, desires repeat injection, has several fishing tournaments coming up.  Obesity: Desires to restart phentermine.  Past medical history, Surgical history, Family history not pertinant except as noted below, Social history, Allergies, and medications have been entered into the medical record, reviewed, and no changes needed.   Review of Systems: No fevers, chills, night sweats, weight loss, chest pain, or shortness of breath.   Objective:    General: Well Developed, well nourished, and in no acute distress.  Neuro: Alert and oriented x3, extra-ocular muscles intact, sensation grossly intact.  HEENT: Normocephalic, atraumatic, pupils equal round reactive to light, neck supple, no masses, no lymphadenopathy, thyroid nonpalpable.  Skin: Warm and dry, no rashes. Cardiac: Regular rate and rhythm, no murmurs rubs or gallops, no lower extremity edema.  Respiratory: Clear to auscultation bilaterally. Not using accessory muscles, speaking in full sentences. Left Elbow: Unremarkable to inspection. Range of motion full pronation, supination, flexion, extension. Strength is full to all of the above directions Stable to varus, valgus stress. Negative moving valgus stress test. Exquisite tenderness to palpation at the common extensor tendon origin Ulnar nerve does not sublux. Negative cubital tunnel Tinel's.  Procedure: Real-time Ultrasound Guided Injection of Left common extensor tendon origin Device: GE Logiq E  Verbal informed consent obtained.  Time-out conducted.  Noted no overlying erythema, induration, or other signs of local infection.  Skin prepped in a sterile fashion.  Local anesthesia: Topical Ethyl chloride.  With sterile technique and under real time  ultrasound guidance:  25-gauge needle used to inject medication both superficial to and deep to the origin of the common extensor tendon. A total of 1 mL kenalog 40, 1 mL lidocaine, 1 mL Marcaine injected. Completed without difficulty  Pain immediately resolved suggesting accurate placement of the medication.  Advised to call if fevers/chills, erythema, induration, drainage, or persistent bleeding.  Images permanently stored and available for review in the ultrasound unit.  Impression: Technically successful ultrasound guided injection.  Impression and Recommendations:    I spent 40 minutes with this patient, greater than 50% was face-to-face time counseling regarding the above diagnoses

## 2015-11-16 NOTE — Addendum Note (Signed)
Addended by: Monica BectonHEKKEKANDAM, THOMAS J on: 11/16/2015 10:34 AM   Modules accepted: Orders

## 2015-11-16 NOTE — Assessment & Plan Note (Signed)
Left-sided tennis elbow injection as above, refilling pain medication.

## 2015-12-05 ENCOUNTER — Other Ambulatory Visit: Payer: Self-pay | Admitting: Sports Medicine

## 2016-01-04 ENCOUNTER — Other Ambulatory Visit: Payer: Self-pay | Admitting: Sports Medicine

## 2016-01-16 ENCOUNTER — Encounter: Payer: Self-pay | Admitting: Sports Medicine

## 2016-01-16 ENCOUNTER — Ambulatory Visit (INDEPENDENT_AMBULATORY_CARE_PROVIDER_SITE_OTHER): Payer: Commercial Managed Care - HMO | Admitting: Sports Medicine

## 2016-01-16 DIAGNOSIS — E785 Hyperlipidemia, unspecified: Secondary | ICD-10-CM

## 2016-01-16 DIAGNOSIS — E669 Obesity, unspecified: Secondary | ICD-10-CM

## 2016-01-16 DIAGNOSIS — M10039 Idiopathic gout, unspecified wrist: Secondary | ICD-10-CM

## 2016-01-16 DIAGNOSIS — M67432 Ganglion, left wrist: Secondary | ICD-10-CM

## 2016-01-16 DIAGNOSIS — I10 Essential (primary) hypertension: Secondary | ICD-10-CM | POA: Diagnosis not present

## 2016-01-16 DIAGNOSIS — M109 Gout, unspecified: Secondary | ICD-10-CM

## 2016-01-16 MED ORDER — LEVOTHYROXINE SODIUM 88 MCG PO TABS: ORAL_TABLET | ORAL | 1 refills | Status: DC

## 2016-01-16 MED ORDER — ALLOPURINOL 300 MG PO TABS: ORAL_TABLET | ORAL | 2 refills | Status: DC

## 2016-01-16 MED ORDER — ICOSAPENT ETHYL 1 G PO CAPS
1.0000 | ORAL_CAPSULE | Freq: Two times a day (BID) | ORAL | 1 refills | Status: DC
Start: 2016-01-16 — End: 2016-05-07

## 2016-01-16 MED ORDER — PHENTERMINE HCL 37.5 MG PO TABS: ORAL_TABLET | ORAL | 0 refills | Status: DC

## 2016-01-16 MED ORDER — COLCHICINE 0.6 MG PO TABS: 0.6000 mg | ORAL_TABLET | Freq: Every day | ORAL | 3 refills | Status: DC

## 2016-01-16 MED ORDER — DICLOFENAC SODIUM 75 MG PO TBEC
75.0000 mg | DELAYED_RELEASE_TABLET | Freq: Two times a day (BID) | ORAL | 5 refills | Status: DC
Start: 2016-01-16 — End: 2016-09-11

## 2016-01-16 NOTE — Assessment & Plan Note (Signed)
About have surgery.

## 2016-01-16 NOTE — Progress Notes (Signed)
  Subjective:    CC: Follow-up  HPI: Hypertension: Minimally elevated, has not taken his lisinopril  Obesity: Needs a refill on phentermine  Gout: Stable  Ganglion cyst of wrist: Has operative intervention coming up, MRI arthrogram did confirm ganglion cyst.  Past medical history, Surgical history, Family history not pertinant except as noted below, Social history, Allergies, and medications have been entered into the medical record, reviewed, and no changes needed.   Review of Systems: No fevers, chills, night sweats, weight loss, chest pain, or shortness of breath.   Objective:    General: Well Developed, well nourished, and in no acute distress.  Neuro: Alert and oriented x3, extra-ocular muscles intact, sensation grossly intact.  HEENT: Normocephalic, atraumatic, pupils equal round reactive to light, neck supple, no masses, no lymphadenopathy, thyroid nonpalpable.  Skin: Warm and dry, no rashes. Cardiac: Regular rate and rhythm, no murmurs rubs or gallops, no lower extremity edema.  Respiratory: Clear to auscultation bilaterally. Not using accessory muscles, speaking in full sentences.  Impression and Recommendations:    Obesity 3 pound weight loss, refilling phentermine for 2 months.  Hypertension At the high limit of normal, rechecking. Has been off of lisinopril we will avoid thiazides due to his gout.  Ganglion cyst of wrist About have surgery.

## 2016-01-16 NOTE — Assessment & Plan Note (Addendum)
At the high limit of normal, rechecking. Has been off of lisinopril we will avoid thiazides due to his gout.

## 2016-01-16 NOTE — Assessment & Plan Note (Signed)
3 pound weight loss, refilling phentermine for 2 months.

## 2016-03-12 ENCOUNTER — Ambulatory Visit (INDEPENDENT_AMBULATORY_CARE_PROVIDER_SITE_OTHER): Payer: Commercial Managed Care - HMO | Admitting: Sports Medicine

## 2016-03-12 ENCOUNTER — Encounter: Payer: Self-pay | Admitting: Sports Medicine

## 2016-03-12 DIAGNOSIS — M25571 Pain in right ankle and joints of right foot: Secondary | ICD-10-CM | POA: Diagnosis not present

## 2016-03-12 DIAGNOSIS — E669 Obesity, unspecified: Secondary | ICD-10-CM

## 2016-03-12 DIAGNOSIS — I10 Essential (primary) hypertension: Secondary | ICD-10-CM | POA: Diagnosis not present

## 2016-03-12 DIAGNOSIS — E7800 Pure hypercholesterolemia, unspecified: Secondary | ICD-10-CM | POA: Diagnosis not present

## 2016-03-12 DIAGNOSIS — Z299 Encounter for prophylactic measures, unspecified: Secondary | ICD-10-CM

## 2016-03-12 DIAGNOSIS — S93601A Unspecified sprain of right foot, initial encounter: Secondary | ICD-10-CM

## 2016-03-12 DIAGNOSIS — G8929 Other chronic pain: Secondary | ICD-10-CM

## 2016-03-12 MED ORDER — OXYCODONE-ACETAMINOPHEN 5-325 MG PO TABS: 1.0000 | ORAL_TABLET | Freq: Three times a day (TID) | ORAL | 0 refills | Status: DC | PRN

## 2016-03-12 MED ORDER — PHENTERMINE HCL 37.5 MG PO TABS: ORAL_TABLET | ORAL | 0 refills | Status: DC

## 2016-03-12 MED ORDER — PREDNISONE 50 MG PO TABS: ORAL_TABLET | ORAL | 0 refills | Status: DC

## 2016-03-12 NOTE — Assessment & Plan Note (Addendum)
Rechecking lipids.  Adding fenofibrate, recheck lipids in 3 months

## 2016-03-12 NOTE — Assessment & Plan Note (Signed)
Refilling phentermine. 

## 2016-03-12 NOTE — Assessment & Plan Note (Signed)
Well-controlled, no changes, checking routine blood work. 

## 2016-03-12 NOTE — Assessment & Plan Note (Signed)
Refilling oxycodone. Currently taking diclofenac, adding 5 days of prednisone. Not yet hurting enough to consider an injection.

## 2016-03-12 NOTE — Progress Notes (Signed)
    Subjective:    CC: weight re-check  HPI: 45 yo presenting for weight re-check. He has lost 5 pounds over the past two months since his last appointment.  Currently going into his 5th month on phentermine.  He says he continues to be hungry all the time but can control his cravings.   Also complaining of chronic R ankle pain.  Pain is similar to previous pain.  He takes diclofenac daily with some relief, as well as half a tab of Oxy as needed.  He wants to know if there's anything else he can take for the pain without requiring an injection.    Past medical history:  Negative.  See flowsheet/record as well for more information.  Surgical history: Negative.  See flowsheet/record as well for more information.  Family history: Negative.  See flowsheet/record as well for more information.  Social history: Negative.  See flowsheet/record as well for more information.  Allergies, and medications have been entered into the medical record, reviewed, and no changes needed.   Review of Systems: No fevers, chills, night sweats, weight loss, chest pain, or shortness of breath.   Objective:    General: Well Developed, well nourished, and in no acute distress.  Neuro: Alert and oriented x3, extra-ocular muscles intact, sensation grossly intact.  HEENT: Normocephalic, atraumatic, pupils equal round reactive to light, neck supple, no masses, no lymphadenopathy, thyroid nonpalpable.  Skin: Warm and dry, no rashes. Cardiac: Regular rate and rhythm, no murmurs rubs or gallops, no lower extremity edema.  Respiratory: Clear to auscultation bilaterally. Not using accessory muscles, speaking in full sentences.   Impression and Recommendations:    1. Obesity: lost 5 pounds in past 2 months on phentermine.  Continue for another 2 months -refill phentermine   2. HTN: well-controlled, BP 130/82 in clinic today -Continue lisinopril 20mg  daily   3. L ankle pain: not interested in injection at this time.  Will prescribe short-course of steroids.  -Refill diclofenac  -5 day course of steroids    4. Health maintenance -HIV screen

## 2016-04-06 LAB — CBC
HCT: 44.4 % (ref 38.5–50.0)
Hemoglobin: 15.3 g/dL (ref 13.2–17.1)
MCH: 29.4 pg (ref 27.0–33.0)
MCHC: 34.5 g/dL (ref 32.0–36.0)
MCV: 85.2 fL (ref 80.0–100.0)
MPV: 10.1 fL (ref 7.5–12.5)
Platelets: 251 K/uL (ref 140–400)
RBC: 5.21 MIL/uL (ref 4.20–5.80)
RDW: 13.2 % (ref 11.0–15.0)
WBC: 8.3 K/uL (ref 3.8–10.8)

## 2016-04-06 LAB — LIPID PANEL
Cholesterol: 155 mg/dL (ref 125–200)
HDL: 42 mg/dL (ref >=40)
LDL Cholesterol: 40 mg/dL (ref <130)
Total CHOL/HDL Ratio: 3.7 Ratio (ref <=5.0)
Triglycerides: 363 mg/dL — ABNORMAL HIGH (ref <150)
VLDL: 73 mg/dL — ABNORMAL HIGH (ref <30)

## 2016-04-06 LAB — COMPREHENSIVE METABOLIC PANEL
Alkaline Phosphatase: 47 U/L (ref 40–115)
BUN: 30 mg/dL — ABNORMAL HIGH (ref 7–25)
Glucose, Bld: 258 mg/dL — ABNORMAL HIGH (ref 65–99)
Potassium: 4.5 mmol/L (ref 3.5–5.3)
Sodium: 134 mmol/L — ABNORMAL LOW (ref 135–146)
Total Bilirubin: 0.5 mg/dL (ref 0.2–1.2)
Total Protein: 6.8 g/dL (ref 6.1–8.1)

## 2016-04-06 LAB — COMPREHENSIVE METABOLIC PANEL WITH GFR
ALT: 11 U/L (ref 9–46)
AST: 15 U/L (ref 10–40)
Albumin: 3.8 g/dL (ref 3.6–5.1)
CO2: 20 mmol/L (ref 20–31)
Calcium: 9.9 mg/dL (ref 8.6–10.3)
Chloride: 101 mmol/L (ref 98–110)
Creat: 1.17 mg/dL (ref 0.60–1.35)

## 2016-04-06 LAB — HIV ANTIBODY (ROUTINE TESTING W REFLEX): HIV 1&2 Ab, 4th Generation: NONREACTIVE

## 2016-04-06 LAB — HEMOGLOBIN A1C
Hgb A1c MFr Bld: 5.5 % (ref <5.7)
Mean Plasma Glucose: 111 mg/dL

## 2016-04-06 MED ORDER — FENOFIBRATE 145 MG PO TABS: 145.0000 mg | ORAL_TABLET | Freq: Every day | ORAL | 3 refills | Status: DC

## 2016-04-06 NOTE — Addendum Note (Signed)
Addended by: Monica BectonHEKKEKANDAM, Ayshia Gramlich J on: 04/06/2016 01:26 PM   Modules accepted: Orders

## 2016-05-07 ENCOUNTER — Other Ambulatory Visit: Payer: Self-pay | Admitting: Sports Medicine

## 2016-05-07 ENCOUNTER — Ambulatory Visit: Payer: Self-pay | Admitting: Sports Medicine

## 2016-05-07 DIAGNOSIS — E785 Hyperlipidemia, unspecified: Secondary | ICD-10-CM

## 2016-05-08 ENCOUNTER — Ambulatory Visit (INDEPENDENT_AMBULATORY_CARE_PROVIDER_SITE_OTHER): Payer: Commercial Managed Care - HMO | Admitting: Sports Medicine

## 2016-05-08 ENCOUNTER — Encounter: Payer: Self-pay | Admitting: Sports Medicine

## 2016-05-08 DIAGNOSIS — E669 Obesity, unspecified: Secondary | ICD-10-CM

## 2016-05-08 DIAGNOSIS — M109 Gout, unspecified: Secondary | ICD-10-CM

## 2016-05-08 MED ORDER — TOPIRAMATE 50 MG PO TABS: ORAL_TABLET | ORAL | 0 refills | Status: DC

## 2016-05-08 MED ORDER — INDOMETHACIN 50 MG PO CAPS: 50.0000 mg | ORAL_CAPSULE | Freq: Two times a day (BID) | ORAL | 0 refills | Status: DC

## 2016-05-08 NOTE — Assessment & Plan Note (Signed)
Has done extremely well with weight loss, discontinue phentermine, adding Topamax, for weight loss and migraine prevention.

## 2016-05-08 NOTE — Assessment & Plan Note (Signed)
Having a early flare in his wrist. Uric acid levels were down to 3. Adding indomethacin for use as needed.

## 2016-05-08 NOTE — Progress Notes (Signed)
  Subjective:    CC: Follow-up  HPI: Obesity: Agreeable to discontinue phentermine, would like to go on Topamax in its place.  Gout: Having an early flare in his left wrist. Declines injection.  Past medical history:  Negative.  See flowsheet/record as well for more information.  Surgical history: Negative.  See flowsheet/record as well for more information.  Family history: Negative.  See flowsheet/record as well for more information.  Social history: Negative.  See flowsheet/record as well for more information.  Allergies, and medications have been entered into the medical record, reviewed, and no changes needed.   Review of Systems: No fevers, chills, night sweats, weight loss, chest pain, or shortness of breath.   Objective:    General: Well Developed, well nourished, and in no acute distress.  Neuro: Alert and oriented x3, extra-ocular muscles intact, sensation grossly intact.  HEENT: Normocephalic, atraumatic, pupils equal round reactive to light, neck supple, no masses, no lymphadenopathy, thyroid nonpalpable.  Skin: Warm and dry, no rashes. Cardiac: Regular rate and rhythm, no murmurs rubs or gallops, no lower extremity edema.  Respiratory: Clear to auscultation bilaterally. Not using accessory muscles, speaking in full sentences.   Impression and Recommendations:    Gout attack Having a early flare in his wrist. Uric acid levels were down to 3. Adding indomethacin for use as needed.  Obesity Has done extremely well with weight loss, discontinue phentermine, adding Topamax, for weight loss and migraine prevention.  I spent 40 minutes with this patient, greater than 50% was face-to-face time counseling regarding the above diagnoses

## 2016-05-30 ENCOUNTER — Other Ambulatory Visit: Payer: Self-pay | Admitting: Sports Medicine

## 2016-05-30 DIAGNOSIS — E785 Hyperlipidemia, unspecified: Secondary | ICD-10-CM

## 2016-08-03 ENCOUNTER — Telehealth: Payer: Self-pay

## 2016-08-03 DIAGNOSIS — S93601A Unspecified sprain of right foot, initial encounter: Secondary | ICD-10-CM

## 2016-08-03 MED ORDER — OXYCODONE-ACETAMINOPHEN 5-325 MG PO TABS: 1.0000 | ORAL_TABLET | Freq: Three times a day (TID) | ORAL | 0 refills | Status: DC | PRN

## 2016-08-03 NOTE — Telephone Encounter (Signed)
Pt would like to have a refill of his percocet. Please assist.

## 2016-08-03 NOTE — Telephone Encounter (Signed)
Rx in box. 

## 2016-08-13 ENCOUNTER — Ambulatory Visit (INDEPENDENT_AMBULATORY_CARE_PROVIDER_SITE_OTHER): Payer: Commercial Managed Care - HMO | Admitting: Sports Medicine

## 2016-08-13 ENCOUNTER — Encounter: Payer: Self-pay | Admitting: Sports Medicine

## 2016-08-13 DIAGNOSIS — M25512 Pain in left shoulder: Secondary | ICD-10-CM

## 2016-08-13 DIAGNOSIS — N410 Acute prostatitis: Secondary | ICD-10-CM

## 2016-08-13 DIAGNOSIS — E669 Obesity, unspecified: Secondary | ICD-10-CM | POA: Diagnosis not present

## 2016-08-13 DIAGNOSIS — N419 Inflammatory disease of prostate, unspecified: Secondary | ICD-10-CM | POA: Insufficient documentation

## 2016-08-13 LAB — POCT URINALYSIS DIPSTICK
Bilirubin, UA: NEGATIVE
Blood, UA: NEGATIVE
Glucose, UA: NEGATIVE
Ketones, UA: NEGATIVE
Nitrite, UA: NEGATIVE
Protein, UA: NEGATIVE
Spec Grav, UA: 1.02
Urobilinogen, UA: 0.2
pH, UA: 6

## 2016-08-13 MED ORDER — TOPIRAMATE 50 MG PO TABS: ORAL_TABLET | ORAL | 0 refills | Status: DC

## 2016-08-13 MED ORDER — TAMSULOSIN HCL 0.4 MG PO CAPS: 0.4000 mg | ORAL_CAPSULE | Freq: Every day | ORAL | 3 refills | Status: DC

## 2016-08-13 MED ORDER — CIPROFLOXACIN HCL 750 MG PO TABS: 750.0000 mg | ORAL_TABLET | Freq: Two times a day (BID) | ORAL | 0 refills | Status: AC

## 2016-08-13 MED ORDER — CIPROFLOXACIN HCL 750 MG PO TABS: 750.0000 mg | ORAL_TABLET | Freq: Two times a day (BID) | ORAL | 0 refills | Status: DC

## 2016-08-13 NOTE — Addendum Note (Signed)
Addended by: Baird KayUGLAS, Jaylinn Hellenbrand M on: 08/13/2016 04:36 PM   Modules accepted: Orders

## 2016-08-13 NOTE — Progress Notes (Signed)
  Subjective:    CC: Abdominal pain, shoulder pain  HPI: Lower abdominal pain: Present for a couple of weeks now, increasing dysuria, hesitancy, weak stream, dribbling. No constitutional symptoms, history of nephrolithiasis, this feels completely different. Has had a few loose stools.  Left shoulder pain: Localized of the deltoid, worse with overhead activities, moderate, persistent without radiation. Waking him from sleep.  Past medical history:  Negative.  See flowsheet/record as well for more information.  Surgical history: Negative.  See flowsheet/record as well for more information.  Family history: Negative.  See flowsheet/record as well for more information.  Social history: Negative.  See flowsheet/record as well for more information.  Allergies, and medications have been entered into the medical record, reviewed, and no changes needed.   Review of Systems: No fevers, chills, night sweats, weight loss, chest pain, or shortness of breath.   Objective:    General: Well Developed, well nourished, and in no acute distress.  Neuro: Alert and oriented x3, extra-ocular muscles intact, sensation grossly intact.  HEENT: Normocephalic, atraumatic, pupils equal round reactive to light, neck supple, no masses, no lymphadenopathy, thyroid nonpalpable.  Skin: Warm and dry, no rashes. Cardiac: Regular rate and rhythm, no murmurs rubs or gallops, no lower extremity edema.  Respiratory: Clear to auscultation bilaterally. Not using accessory muscles, speaking in full sentences. Abdomen: Soft, nontender, nondistended, no bowel sounds, no palpable masses, no guarding, rigidity, rebound tenderness. Left Shoulder: Inspection reveals no abnormalities, atrophy or asymmetry. Palpation is normal with no tenderness over AC joint or bicipital groove. ROM is full in all planes. Rotator cuff strength normal throughout. Positive Neer and Hawkin's tests, empty can. Speeds and Yergason's tests normal. No  labral pathology noted with negative Obrien's, negative crank, negative clunk, and good stability. Normal scapular function observed. No painful arc and no drop arm sign. No apprehension sign  Procedure: Real-time Ultrasound Guided Injection of left subacromial bursa Device: GE Logiq E  Verbal informed consent obtained.  Time-out conducted.  Noted no overlying erythema, induration, or other signs of local infection.  Skin prepped in a sterile fashion.  Local anesthesia: Topical Ethyl chloride.  With sterile technique and under real time ultrasound guidance:  1 mL kenalog 40, 1 mL lidocaine, 1 mL Marcaine injected into the subacromial bursa, rotator cuff appeared intact. Completed without difficulty  Pain immediately resolved suggesting accurate placement of the medication.  Advised to call if fevers/chills, erythema, induration, drainage, or persistent bleeding.  Images permanently stored and available for review in the ultrasound unit.  Impression: Technically successful ultrasound guided injection.  Impression and Recommendations:    Prostatitis Urine culture, Flomax, ciprofloxacin for 14 days. Return in one month.  Left shoulder pain Subacromial injection as above. Pain is waking him from sleep. Rehabilitation exercises given.  Return in one month.  I spent 25 minutes with this patient, greater than 50% was face-to-face time counseling regarding the above diagnoses, this was separate from the time spent performing the above procedure

## 2016-08-13 NOTE — Assessment & Plan Note (Signed)
Urine culture, Flomax, ciprofloxacin for 14 days. Return in one month.

## 2016-08-13 NOTE — Assessment & Plan Note (Signed)
Subacromial injection as above. Pain is waking him from sleep. Rehabilitation exercises given.  Return in one month.

## 2016-08-15 LAB — URINE CULTURE: Organism ID, Bacteria: NO GROWTH

## 2016-09-11 ENCOUNTER — Encounter: Payer: Self-pay | Admitting: Sports Medicine

## 2016-09-11 ENCOUNTER — Ambulatory Visit (INDEPENDENT_AMBULATORY_CARE_PROVIDER_SITE_OTHER): Payer: Commercial Managed Care - HMO | Admitting: Sports Medicine

## 2016-09-11 VITALS — BP 129/85 | HR 61 | Resp 18 | Wt 184.2 lb

## 2016-09-11 DIAGNOSIS — M109 Gout, unspecified: Secondary | ICD-10-CM

## 2016-09-11 DIAGNOSIS — E782 Mixed hyperlipidemia: Secondary | ICD-10-CM | POA: Diagnosis not present

## 2016-09-11 DIAGNOSIS — M19171 Post-traumatic osteoarthritis, right ankle and foot: Secondary | ICD-10-CM | POA: Diagnosis not present

## 2016-09-11 DIAGNOSIS — S93601A Unspecified sprain of right foot, initial encounter: Secondary | ICD-10-CM

## 2016-09-11 DIAGNOSIS — E7800 Pure hypercholesterolemia, unspecified: Secondary | ICD-10-CM | POA: Diagnosis not present

## 2016-09-11 DIAGNOSIS — M25512 Pain in left shoulder: Secondary | ICD-10-CM | POA: Diagnosis not present

## 2016-09-11 DIAGNOSIS — E669 Obesity, unspecified: Secondary | ICD-10-CM

## 2016-09-11 DIAGNOSIS — N410 Acute prostatitis: Secondary | ICD-10-CM | POA: Diagnosis not present

## 2016-09-11 MED ORDER — OXYCODONE-ACETAMINOPHEN 5-325 MG PO TABS: 1.0000 | ORAL_TABLET | Freq: Three times a day (TID) | ORAL | 0 refills | Status: DC | PRN

## 2016-09-11 MED ORDER — FENOFIBRATE 145 MG PO TABS: 145.0000 mg | ORAL_TABLET | Freq: Every day | ORAL | 2 refills | Status: DC

## 2016-09-11 MED ORDER — LEVOTHYROXINE SODIUM 88 MCG PO TABS: ORAL_TABLET | ORAL | 1 refills | Status: DC

## 2016-09-11 MED ORDER — PRAVASTATIN SODIUM 40 MG PO TABS: 40.0000 mg | ORAL_TABLET | Freq: Every day | ORAL | 2 refills | Status: DC

## 2016-09-11 MED ORDER — INDOMETHACIN 50 MG PO CAPS: 50.0000 mg | ORAL_CAPSULE | Freq: Two times a day (BID) | ORAL | 0 refills | Status: DC

## 2016-09-11 MED ORDER — DIAZEPAM 5 MG PO TABS: ORAL_TABLET | ORAL | 0 refills | Status: DC

## 2016-09-11 MED ORDER — LISINOPRIL 20 MG PO TABS: 20.0000 mg | ORAL_TABLET | Freq: Every day | ORAL | 1 refills | Status: DC

## 2016-09-11 MED ORDER — ALLOPURINOL 300 MG PO TABS: ORAL_TABLET | ORAL | 2 refills | Status: DC

## 2016-09-11 MED ORDER — ICOSAPENT ETHYL 1 G PO CAPS: 1.0000 | ORAL_CAPSULE | Freq: Two times a day (BID) | ORAL | 2 refills | Status: DC

## 2016-09-11 MED ORDER — TOPIRAMATE 50 MG PO TABS: ORAL_TABLET | ORAL | 0 refills | Status: DC

## 2016-09-11 NOTE — Progress Notes (Signed)
  Subjective:    CC: Follow-up  HPI: Prostatitis: Resolved with Cipro, did get a bit of retrograde ejaculation with Flomax, has since stopped and all has returned to normal.  Right ankle osteoarthritis: Agreeable to proceed with injection at some point.  Left shoulder pain: Resolved with subacromial injection.  Past medical history:  Negative.  See flowsheet/record as well for more information.  Surgical history: Negative.  See flowsheet/record as well for more information.  Family history: Negative.  See flowsheet/record as well for more information.  Social history: Negative.  See flowsheet/record as well for more information.  Allergies, and medications have been entered into the medical record, reviewed, and no changes needed.   Review of Systems: No fevers, chills, night sweats, weight loss, chest pain, or shortness of breath.   Objective:    General: Well Developed, well nourished, and in no acute distress.  Neuro: Alert and oriented x3, extra-ocular muscles intact, sensation grossly intact.  HEENT: Normocephalic, atraumatic, pupils equal round reactive to light, neck supple, no masses, no lymphadenopathy, thyroid nonpalpable.  Skin: Warm and dry, no rashes. Cardiac: Regular rate and rhythm, no murmurs rubs or gallops, no lower extremity edema.  Respiratory: Clear to auscultation bilaterally. Not using accessory muscles, speaking in full sentences.  Impression and Recommendations:    Prostatitis Resolved, pain-free after Cipro. Flomax did improve his urination during his prostatitis. He did take it for the rest of the month and experienced some retrograde ejaculation.  Left shoulder pain Resolved after subacromial injection.  Post-traumatic osteoarthritis of right ankle Valium for preprocedural anxiolysis and he will return for ankle joint injection.  I spent 25 minutes with this patient, greater than 50% was face-to-face time counseling regarding the above  diagnoses

## 2016-09-11 NOTE — Addendum Note (Signed)
Addended by: Baird KayUGLAS, Hikari Tripp M on: 09/11/2016 04:45 PM   Modules accepted: Orders

## 2016-09-11 NOTE — Assessment & Plan Note (Signed)
Resolved after subacromial injection. 

## 2016-09-11 NOTE — Assessment & Plan Note (Signed)
Valium for preprocedural anxiolysis and he will return for ankle joint injection.

## 2016-09-11 NOTE — Assessment & Plan Note (Signed)
Resolved, pain-free after Cipro. Flomax did improve his urination during his prostatitis. He did take it for the rest of the month and experienced some retrograde ejaculation.

## 2016-09-12 ENCOUNTER — Telehealth: Payer: Self-pay | Admitting: *Deleted

## 2016-09-12 NOTE — Telephone Encounter (Signed)
PA submitted through covermymeds [id number]

## 2016-09-17 NOTE — Telephone Encounter (Signed)
Response from the insurance company is that vascepa is the plans list of covered drugs and a prior Berkley Harvey is not needed at this time

## 2016-09-25 ENCOUNTER — Ambulatory Visit (INDEPENDENT_AMBULATORY_CARE_PROVIDER_SITE_OTHER): Payer: Commercial Managed Care - HMO | Admitting: Sports Medicine

## 2016-09-25 DIAGNOSIS — M19171 Post-traumatic osteoarthritis, right ankle and foot: Secondary | ICD-10-CM

## 2016-09-25 NOTE — Assessment & Plan Note (Signed)
Ankle joint injection as above, return in 4 weeks. We did discuss joint replacement, Drew Cabrera is going to look into this.

## 2016-09-25 NOTE — Progress Notes (Signed)
  Subjective:    CC: Follow-up  HPI: This is a pleasant 46 year old male with known post traumatic right knee osteoarthritis, for sometime now we've discussed ankle injection, he has been resistant due to fears of the procedure itself. Pain is now severe, persistent, localized without radiation and he desires injection treatment today.  Past medical history:  Negative.  See flowsheet/record as well for more information.  Surgical history: Negative.  See flowsheet/record as well for more information.  Family history: Negative.  See flowsheet/record as well for more information.  Social history: Negative.  See flowsheet/record as well for more information.  Allergies, and medications have been entered into the medical record, reviewed, and no changes needed.   Review of Systems: No fevers, chills, night sweats, weight loss, chest pain, or shortness of breath.   Objective:    General: Well Developed, well nourished, and in no acute distress.  Neuro: Alert and oriented x3, extra-ocular muscles intact, sensation grossly intact.  HEENT: Normocephalic, atraumatic, pupils equal round reactive to light, neck supple, no masses, no lymphadenopathy, thyroid nonpalpable.  Skin: Warm and dry, no rashes. Cardiac: Regular rate and rhythm, no murmurs rubs or gallops, no lower extremity edema.  Respiratory: Clear to auscultation bilaterally. Not using accessory muscles, speaking in full sentences. Right Ankle: No visible erythema or swelling. Range of motion is full in all directions. Strength is 5/5 in all directions. Stable lateral and medial ligaments; squeeze test and kleiger test unremarkable; Tender to palpation over the talar dome and ankle joint No pain at base of 5th MT; No tenderness over cuboid; No tenderness over N spot or navicular prominence No tenderness on posterior aspects of lateral and medial malleolus No sign of peroneal tendon subluxations; Negative tarsal tunnel tinel's Able to  walk 4 steps.  Procedure: Real-time Ultrasound Guided Injection of right ankle joint Device: GE Logiq E  Verbal informed consent obtained.  Time-out conducted.  Noted no overlying erythema, induration, or other signs of local infection.  Skin prepped in a sterile fashion.  Local anesthesia: Topical Ethyl chloride.  With sterile technique and under real time ultrasound guidance:  Using a short axis injection I guided the needle just under the dorsalis pedis artery into the joint and injected 1 mL kenalog 40, 1 mL lidocaine, 1 mL bupivacaine Completed without difficulty  Pain immediately resolved suggesting accurate placement of the medication.  Advised to call if fevers/chills, erythema, induration, drainage, or persistent bleeding.  Images permanently stored and available for review in the ultrasound unit.  Impression: Technically successful ultrasound guided injection.  Impression and Recommendations:    Post-traumatic osteoarthritis of right ankle Ankle joint injection as above, return in 4 weeks. We did discuss joint replacement, Celestine is going to look into this.

## 2016-10-22 ENCOUNTER — Ambulatory Visit (INDEPENDENT_AMBULATORY_CARE_PROVIDER_SITE_OTHER): Payer: Commercial Managed Care - HMO | Admitting: Sports Medicine

## 2016-10-22 ENCOUNTER — Encounter: Payer: Self-pay | Admitting: Sports Medicine

## 2016-10-22 DIAGNOSIS — M19171 Post-traumatic osteoarthritis, right ankle and foot: Secondary | ICD-10-CM | POA: Diagnosis not present

## 2016-10-22 NOTE — Progress Notes (Signed)
  Subjective:    CC: Follow-up  HPI: Ankle arthritis: Post traumatic osteoarthritis of the right ankle, injected 1 month ago doing extremely well.  Past medical history:  Negative.  See flowsheet/record as well for more information.  Surgical history: Negative.  See flowsheet/record as well for more information.  Family history: Negative.  See flowsheet/record as well for more information.  Social history: Negative.  See flowsheet/record as well for more information.  Allergies, and medications have been entered into the medical record, reviewed, and no changes needed.   Review of Systems: No fevers, chills, night sweats, weight loss, chest pain, or shortness of breath.   Objective:    General: Well Developed, well nourished, and in no acute distress.  Neuro: Alert and oriented x3, extra-ocular muscles intact, sensation grossly intact.  HEENT: Normocephalic, atraumatic, pupils equal round reactive to light, neck supple, no masses, no lymphadenopathy, thyroid nonpalpable.  Skin: Warm and dry, no rashes. Cardiac: Regular rate and rhythm, no murmurs rubs or gallops, no lower extremity edema.  Respiratory: Clear to auscultation bilaterally. Not using accessory muscles, speaking in full sentences.  Impression and Recommendations:    Post-traumatic osteoarthritis of right ankle Doing extremely well after ankle injection at the last visit. Return as needed.

## 2016-10-22 NOTE — Assessment & Plan Note (Signed)
Doing extremely well after ankle injection at the last visit. Return as needed.

## 2016-10-30 ENCOUNTER — Ambulatory Visit (INDEPENDENT_AMBULATORY_CARE_PROVIDER_SITE_OTHER): Payer: Commercial Managed Care - HMO | Admitting: Sports Medicine

## 2016-10-30 DIAGNOSIS — M25512 Pain in left shoulder: Secondary | ICD-10-CM

## 2016-10-30 NOTE — Assessment & Plan Note (Signed)
Subacromial injection as above, he never really did the exercises last time so agrees to do it this time. Return in one month as needed.

## 2016-10-30 NOTE — Progress Notes (Signed)
  Subjective:    CC: Left shoulder pain  HPI: Drew Cabrera is a pleasant 46 year old male, we injected his shoulder about 3 months ago, he had rotator cuff impingement type symptoms. He did extremely well but now is having recurrence of pain.  He does admit to really not doing his rehabilitation exercises.  Past medical history:  Negative.  See flowsheet/record as well for more information.  Surgical history: Negative.  See flowsheet/record as well for more information.  Family history: Negative.  See flowsheet/record as well for more information.  Social history: Negative.  See flowsheet/record as well for more information.  Allergies, and medications have been entered into the medical record, reviewed, and no changes needed.   Review of Systems: No fevers, chills, night sweats, weight loss, chest pain, or shortness of breath.   Objective:    General: Well Developed, well nourished, and in no acute distress.  Neuro: Alert and oriented x3, extra-ocular muscles intact, sensation grossly intact.  HEENT: Normocephalic, atraumatic, pupils equal round reactive to light, neck supple, no masses, no lymphadenopathy, thyroid nonpalpable.  Skin: Warm and dry, no rashes. Cardiac: Regular rate and rhythm, no murmurs rubs or gallops, no lower extremity edema.  Respiratory: Clear to auscultation bilaterally. Not using accessory muscles, speaking in full sentences. Left Shoulder: Inspection reveals no abnormalities, atrophy or asymmetry. Palpation is normal with no tenderness over AC joint or bicipital groove. ROM is full in all planes. Rotator cuff strength normal throughout. Positive Neer and Hawkin's tests, empty can. Speeds and Yergason's tests normal. No labral pathology noted with negative Obrien's, negative crank, negative clunk, and good stability. Normal scapular function observed. No painful arc and no drop arm sign. No apprehension sign  Procedure: Real-time Ultrasound Guided Injection of  left subacromial bursa Device: GE Logiq E  Verbal informed consent obtained.  Time-out conducted.  Noted no overlying erythema, induration, or other signs of local infection.  Skin prepped in a sterile fashion.  Local anesthesia: Topical Ethyl chloride.  With sterile technique and under real time ultrasound guidance: 1 mL kenalog 40, 1 mL lidocaine, 1 mL bupivacaine injected easily.  Completed without difficulty  Pain immediately resolved suggesting accurate placement of the medication.  Advised to call if fevers/chills, erythema, induration, drainage, or persistent bleeding.  Images permanently stored and available for review in the ultrasound unit.  Impression: Technically successful ultrasound guided injection.  Impression and Recommendations:    Left shoulder pain Subacromial injection as above, he never really did the exercises last time so agrees to do it this time. Return in one month as needed.

## 2016-11-29 ENCOUNTER — Other Ambulatory Visit: Payer: Self-pay | Admitting: Physician Assistant

## 2016-12-05 ENCOUNTER — Other Ambulatory Visit: Payer: Self-pay

## 2016-12-05 MED ORDER — DICLOFENAC SODIUM 75 MG PO TBEC: 75.0000 mg | DELAYED_RELEASE_TABLET | Freq: Two times a day (BID) | ORAL | 5 refills | Status: DC

## 2017-01-07 ENCOUNTER — Other Ambulatory Visit: Payer: Self-pay | Admitting: Physician Assistant

## 2017-01-07 DIAGNOSIS — E669 Obesity, unspecified: Secondary | ICD-10-CM

## 2017-01-30 ENCOUNTER — Ambulatory Visit (INDEPENDENT_AMBULATORY_CARE_PROVIDER_SITE_OTHER): Payer: Commercial Managed Care - HMO | Admitting: Sports Medicine

## 2017-01-30 DIAGNOSIS — S93601A Unspecified sprain of right foot, initial encounter: Secondary | ICD-10-CM | POA: Diagnosis not present

## 2017-01-30 DIAGNOSIS — R5383 Other fatigue: Secondary | ICD-10-CM | POA: Insufficient documentation

## 2017-01-30 DIAGNOSIS — M25512 Pain in left shoulder: Secondary | ICD-10-CM

## 2017-01-30 MED ORDER — OXYCODONE-ACETAMINOPHEN 5-325 MG PO TABS: 1.0000 | ORAL_TABLET | Freq: Three times a day (TID) | ORAL | 0 refills | Status: DC | PRN

## 2017-01-30 NOTE — Assessment & Plan Note (Signed)
Ain today is more pain today's more referable to the labrum versus joint.  Glenohumeral injection as above, home rehabilitation exercises will be continued. If no improvement we will proceed with MRI arthrogram.

## 2017-01-30 NOTE — Assessment & Plan Note (Addendum)
Current adjustment disorder versus major depression. Checking some routine blood work including testosterone, thyroid function, however he did have a high PHQ9 score and is having some domestic issues. If persistence of symptoms in spite of normal blood work we will start low-dose citalopram.  Adding citalopram 10 mg, return in one month for a PHQ9 and GAD7.

## 2017-01-30 NOTE — Progress Notes (Addendum)
  Subjective:    CC: Follow-up  HPI: Left shoulder pain: Persistent, subacromial injection 3 months ago had good results with regards to his impingement related pain, now has pain mostly in the back of the shoulder.  Mood disorder: For the past several weeks has noted some depressed mood, poor energy, difficulty sleeping, lack of drive. On further questioning he is having some issues with his relationship with his wife, they don't connect anymore, they don't talk much anymore. He denies any suicidal or homicidal ideation. He is somewhat resistant to accepting that this is quite possibly a mood disorder so he would like recheck testosterone and thyroid function first. He did become tearful when discussing this.  Past medical history:  Negative.  See flowsheet/record as well for more information.  Surgical history: Negative.  See flowsheet/record as well for more information.  Family history: Negative.  See flowsheet/record as well for more information.  Social history: Negative.  See flowsheet/record as well for more information.  Allergies, and medications have been entered into the medical record, reviewed, and no changes needed.   Review of Systems: No fevers, chills, night sweats, weight loss, chest pain, or shortness of breath.   Objective:    General: Well Developed, well nourished, and in no acute distress.  Neuro: Alert and oriented x3, extra-ocular muscles intact, sensation grossly intact.  HEENT: Normocephalic, atraumatic, pupils equal round reactive to light, neck supple, no masses, no lymphadenopathy, thyroid nonpalpable.  Skin: Warm and dry, no rashes. Cardiac: Regular rate and rhythm, no murmurs rubs or gallops, no lower extremity edema.  Respiratory: Clear to auscultation bilaterally. Not using accessory muscles, speaking in full sentences. Left Shoulder: Inspection reveals no abnormalities, atrophy or asymmetry. Palpation is normal with no tenderness over AC joint or  bicipital groove. ROM is full in all planes. Rotator cuff strength normal throughout. No signs of impingement with negative Neer and Hawkin's tests, empty can. Speeds and Yergason's tests normal. Positive Obrien's, negative crank, negative clunk, and good stability. Normal scapular function observed. No painful arc and no drop arm sign. No apprehension sign  Procedure: Real-time Ultrasound Guided Injection of left glenohumeral joint Device: GE Logiq E  Verbal informed consent obtained.  Time-out conducted.  Noted no overlying erythema, induration, or other signs of local infection.  Skin prepped in a sterile fashion.  Local anesthesia: Topical Ethyl chloride.  With sterile technique and under real time ultrasound guidance:  Advanced into the joint with a 22-gauge spinal needle, I then injected 1 mL Kenalog 40, 1 mL lidocaine, 1 mL bupivacaine Completed without difficulty  Pain immediately resolved suggesting accurate placement of the medication.  Advised to call if fevers/chills, erythema, induration, drainage, or persistent bleeding.  Images permanently stored and available for review in the ultrasound unit.  Impression: Technically successful ultrasound guided injection.  Impression and Recommendations:    Left shoulder pain Ain today is more pain today's more referable to the labrum versus joint.  Glenohumeral injection as above, home rehabilitation exercises will be continued. If no improvement we will proceed with MRI arthrogram.  Fatigue/Mood Disorder Current adjustment disorder versus major depression. Checking some routine blood work including testosterone, thyroid function, however he did have a high PHQ9 score and is having some domestic issues. If persistence of symptoms in spite of normal blood work we will start low-dose citalopram.  Adding citalopram 10 mg, return in one month for a PHQ9 and GAD7.

## 2017-01-31 LAB — TESTOSTERONE TOTAL,FREE,BIO, MALES
Albumin: 4.9 g/dL (ref 3.6–5.1)
Sex Hormone Binding: 17 nmol/L (ref 10–50)
Testosterone, Bioavailable: 120.9 ng/dL (ref 110.0–575.0)
Testosterone, Free: 54.2 pg/mL (ref 46.0–224.0)
Testosterone: 276 ng/dL (ref 250–827)

## 2017-01-31 LAB — CBC
HCT: 46.5 % (ref 38.5–50.0)
Hemoglobin: 15.6 g/dL (ref 13.2–17.1)
MCH: 29.7 pg (ref 27.0–33.0)
MCHC: 33.5 g/dL (ref 32.0–36.0)
MCV: 88.6 fL (ref 80.0–100.0)
MPV: 10.2 fL (ref 7.5–12.5)
Platelets: 269 10*3/uL (ref 140–400)
RBC: 5.25 MIL/uL (ref 4.20–5.80)
RDW: 13.4 % (ref 11.0–15.0)
WBC: 6.1 10*3/uL (ref 3.8–10.8)

## 2017-01-31 LAB — TSH: TSH: 2.74 m[IU]/L (ref 0.40–4.50)

## 2017-01-31 LAB — T4, FREE: Free T4: 1.4 ng/dL (ref 0.8–1.8)

## 2017-01-31 LAB — T3, FREE: T3, Free: 2.5 pg/mL (ref 2.3–4.2)

## 2017-02-04 ENCOUNTER — Telehealth: Payer: Self-pay | Admitting: Sports Medicine

## 2017-02-04 NOTE — Telephone Encounter (Signed)
Pt came in for lab results. I printed out the results for him, but he said he didn't know what all of it means. He requested that a nurse call him back to explain results.

## 2017-02-07 ENCOUNTER — Other Ambulatory Visit: Payer: Self-pay

## 2017-02-07 DIAGNOSIS — R5383 Other fatigue: Secondary | ICD-10-CM

## 2017-02-07 MED ORDER — CITALOPRAM HYDROBROMIDE 10 MG PO TABS: 10.0000 mg | ORAL_TABLET | Freq: Every day | ORAL | 3 refills | Status: DC

## 2017-02-07 NOTE — Telephone Encounter (Signed)
Patient has already been notified of results.

## 2017-02-07 NOTE — Addendum Note (Signed)
Addended by: Monica Becton on: 02/07/2017 08:23 AM   Modules accepted: Orders

## 2017-02-12 ENCOUNTER — Other Ambulatory Visit: Payer: Self-pay | Admitting: Physician Assistant

## 2017-03-04 ENCOUNTER — Ambulatory Visit (INDEPENDENT_AMBULATORY_CARE_PROVIDER_SITE_OTHER): Payer: 59 | Admitting: Sports Medicine

## 2017-03-04 ENCOUNTER — Encounter: Payer: Self-pay | Admitting: Sports Medicine

## 2017-03-04 DIAGNOSIS — R5383 Other fatigue: Secondary | ICD-10-CM

## 2017-03-04 MED ORDER — CITALOPRAM HYDROBROMIDE 20 MG PO TABS: 20.0000 mg | ORAL_TABLET | Freq: Every day | ORAL | 3 refills | Status: DC

## 2017-03-04 NOTE — Assessment & Plan Note (Signed)
Fantastic improvement in symptoms, we did discuss the mechanism again. There is some degree of fear with the medication related to depression being stigmatized men. Return in one month for repeat PHQ9 and GAD, increasing to 20 mg.

## 2017-03-04 NOTE — Progress Notes (Signed)
  Subjective:    CC: Follow-up  HPI: Shoulder pain: Resolved with glenohumeral injection at the last visit.  Fatigue/mood disorder: Fantastic improvement with low-dose Celexa, desires to go up, no suicidal or homicidal ideation, has noted significantly decreased irritability, anxiety, mood is better. He still lacks drive.  Past medical history:  Negative.  See flowsheet/record as well for more information.  Surgical history: Negative.  See flowsheet/record as well for more information.  Family history: Negative.  See flowsheet/record as well for more information.  Social history: Negative.  See flowsheet/record as well for more information.  Allergies, and medications have been entered into the medical record, reviewed, and no changes needed.   Review of Systems: No fevers, chills, night sweats, weight loss, chest pain, or shortness of breath.   Objective:    General: Well Developed, well nourished, and in no acute distress.  Neuro: Alert and oriented x3, extra-ocular muscles intact, sensation grossly intact.  HEENT: Normocephalic, atraumatic, pupils equal round reactive to light, neck supple, no masses, no lymphadenopathy, thyroid nonpalpable.  Skin: Warm and dry, no rashes. Cardiac: Regular rate and rhythm, no murmurs rubs or gallops, no lower extremity edema.  Respiratory: Clear to auscultation bilaterally. Not using accessory muscles, speaking in full sentences.  Impression and Recommendations:    Fatigue/Mood Disorder Fantastic improvement in symptoms, we did discuss the mechanism again. There is some degree of fear with the medication related to depression being stigmatized men. Return in one month for repeat PHQ9 and GAD, increasing to 20 mg.  I spent 25 minutes with this patient, greater than 50% was face-to-face time counseling regarding the above diagnoses ___________________________________________ Ihor Austin. Benjamin Stain, M.D., ABFM., CAQSM. Primary Care and Sports  Medicine [place] [facility]  Adjunct Instructor of Family Medicine  [employer]

## 2017-04-01 ENCOUNTER — Ambulatory Visit (INDEPENDENT_AMBULATORY_CARE_PROVIDER_SITE_OTHER): Payer: 59 | Admitting: Sports Medicine

## 2017-04-01 DIAGNOSIS — R5383 Other fatigue: Secondary | ICD-10-CM

## 2017-04-01 MED ORDER — CITALOPRAM HYDROBROMIDE 20 MG PO TABS: 20.0000 mg | ORAL_TABLET | Freq: Every day | ORAL | 3 refills | Status: DC

## 2017-04-01 NOTE — Progress Notes (Signed)
  Subjective:    CC: Generalized anxiety  HPI: Fantastic improvements in mood on 20 mg of Celexa, he is happy with the current dose, no suicidal or homicidal ideation.  Ankle osteoarthritis: Desires injection but he will make a follow-up for this.  Past medical history:  Negative.  See flowsheet/record as well for more information.  Surgical history: Negative.  See flowsheet/record as well for more information.  Family history: Negative.  See flowsheet/record as well for more information.  Social history: Negative.  See flowsheet/record as well for more information.  Allergies, and medications have been entered into the medical record, reviewed, and no changes needed.   Review of Systems: No fevers, chills, night sweats, weight loss, chest pain, or shortness of breath.   Objective:    General: Well Developed, well nourished, and in no acute distress.  Neuro: Alert and oriented x3, extra-ocular muscles intact, sensation grossly intact.  HEENT: Normocephalic, atraumatic, pupils equal round reactive to light, neck supple, no masses, no lymphadenopathy, thyroid nonpalpable.  Skin: Warm and dry, no rashes. Cardiac: Regular rate and rhythm, no murmurs rubs or gallops, no lower extremity edema.  Respiratory: Clear to auscultation bilaterally. Not using accessory muscles, speaking in full sentences.  Impression and Recommendations:    Fatigue/Mood Disorder Doing extremely well, follow-up as needed.  I spent 25 minutes with this patient, greater than 50% was face-to-face time counseling regarding the above diagnoses ___________________________________________ Ihor Austin. Benjamin Stain, M.D., ABFM., CAQSM. Primary Care and Sports Medicine [place] [facility]  Adjunct Instructor of Family Medicine  [employer]

## 2017-04-01 NOTE — Assessment & Plan Note (Signed)
Doing extremely well, follow-up as needed.

## 2017-04-16 ENCOUNTER — Other Ambulatory Visit: Payer: Self-pay | Admitting: Physician Assistant

## 2017-06-07 ENCOUNTER — Encounter: Payer: Self-pay | Admitting: Sports Medicine

## 2017-06-07 ENCOUNTER — Ambulatory Visit: Payer: 59 | Admitting: Sports Medicine

## 2017-06-07 DIAGNOSIS — M19171 Post-traumatic osteoarthritis, right ankle and foot: Secondary | ICD-10-CM

## 2017-06-07 DIAGNOSIS — S93601A Unspecified sprain of right foot, initial encounter: Secondary | ICD-10-CM | POA: Diagnosis not present

## 2017-06-07 MED ORDER — OXYCODONE-ACETAMINOPHEN 5-325 MG PO TABS: 1.0000 | ORAL_TABLET | Freq: Three times a day (TID) | ORAL | 0 refills | Status: DC | PRN

## 2017-06-07 NOTE — Progress Notes (Signed)
  Subjective:    CC: Right ankle pain  HPI: This is a pleasant 46 year old male with posttraumatic right ankle osteoarthritis, previous injection was 8 months ago.  Started to have recurrence of pain over the past several days, moderate, persistent, localized without radiation, no repeat trauma, no mechanical symptoms.  Past medical history:  Negative.  See flowsheet/record as well for more information.  Surgical history: Negative.  See flowsheet/record as well for more information.  Family history: Negative.  See flowsheet/record as well for more information.  Social history: Negative.  See flowsheet/record as well for more information.  Allergies, and medications have been entered into the medical record, reviewed, and no changes needed.   (To billers/coders, pertinent past medical, social, surgical, family history can be found in problem list, if problem list is marked as reviewed then this indicates that past medical, social, surgical, family history was also reviewed)  Review of Systems: No fevers, chills, night sweats, weight loss, chest pain, or shortness of breath.   Objective:    General: Well Developed, well nourished, and in no acute distress.  Neuro: Alert and oriented x3, extra-ocular muscles intact, sensation grossly intact.  HEENT: Normocephalic, atraumatic, pupils equal round reactive to light, neck supple, no masses, no lymphadenopathy, thyroid nonpalpable.  Skin: Warm and dry, no rashes. Cardiac: Regular rate and rhythm, no murmurs rubs or gallops, no lower extremity edema.  Respiratory: Clear to auscultation bilaterally. Not using accessory muscles, speaking in full sentences. Right ankle: Swollen with tenderness over the talar dome Range of motion is full in all directions. Strength is 5/5 in all directions. Stable lateral and medial ligaments; squeeze test and kleiger test unremarkable; Talar dome nontender; No pain at base of 5th MT; No tenderness over cuboid; No  tenderness over N spot or navicular prominence No tenderness on posterior aspects of lateral and medial malleolus No sign of peroneal tendon subluxations; Negative tarsal tunnel tinel's Able to walk 4 steps.  Procedure: Real-time Ultrasound Guided Injection of right ankle joint Device: GE Logiq E  Verbal informed consent obtained.  Time-out conducted.  Noted no overlying erythema, induration, or other signs of local infection.  Skin prepped in a sterile fashion.  Local anesthesia: Topical Ethyl chloride.  With sterile technique and under real time ultrasound guidance:   I advanced a 25-gauge needle and a short axis to the ultrasound probe, between the tibia and the talus, I then injected 1 cc kenalog 40, 1 cc lidocaine, 1 cc bupivacaine. Completed without difficulty  Pain immediately resolved suggesting accurate placement of the medication.  Advised to call if fevers/chills, erythema, induration, drainage, or persistent bleeding.  Images permanently stored and available for review in the ultrasound unit.  Impression: Technically successful ultrasound guided injection.  Impression and Recommendations:    Post-traumatic osteoarthritis of right ankle Previous injection was 8 months ago, repeat right ankle injection as above.  Return as needed. ___________________________________________ Ihor Austinhomas J. Benjamin Stainhekkekandam, M.D., ABFM., CAQSM. Primary Care and Sports Medicine [place] [facility]  Adjunct Instructor of Family Medicine  [employer]

## 2017-06-07 NOTE — Assessment & Plan Note (Signed)
Previous injection was 8 months ago, repeat right ankle injection as above.  Return as needed.

## 2017-06-14 ENCOUNTER — Encounter: Payer: Self-pay | Admitting: Physician Assistant

## 2017-06-14 ENCOUNTER — Ambulatory Visit (INDEPENDENT_AMBULATORY_CARE_PROVIDER_SITE_OTHER): Payer: 59 | Admitting: Physician Assistant

## 2017-06-14 VITALS — BP 106/67 | HR 58 | Temp 97.8°F | Resp 16 | Wt 193.3 lb

## 2017-06-14 DIAGNOSIS — Z131 Encounter for screening for diabetes mellitus: Secondary | ICD-10-CM

## 2017-06-14 DIAGNOSIS — Z136 Encounter for screening for cardiovascular disorders: Secondary | ICD-10-CM

## 2017-06-14 DIAGNOSIS — Z5181 Encounter for therapeutic drug level monitoring: Secondary | ICD-10-CM

## 2017-06-14 DIAGNOSIS — E785 Hyperlipidemia, unspecified: Secondary | ICD-10-CM | POA: Diagnosis not present

## 2017-06-14 DIAGNOSIS — Z13220 Encounter for screening for lipoid disorders: Secondary | ICD-10-CM

## 2017-06-14 DIAGNOSIS — Z130 Encounter for screening for diseases of the blood and blood-forming organs and certain disorders involving the immune mechanism: Secondary | ICD-10-CM

## 2017-06-14 DIAGNOSIS — I10 Essential (primary) hypertension: Secondary | ICD-10-CM | POA: Diagnosis not present

## 2017-06-14 DIAGNOSIS — Z0001 Encounter for general adult medical examination with abnormal findings: Secondary | ICD-10-CM | POA: Diagnosis not present

## 2017-06-14 DIAGNOSIS — E079 Disorder of thyroid, unspecified: Secondary | ICD-10-CM | POA: Diagnosis not present

## 2017-06-14 DIAGNOSIS — E663 Overweight: Secondary | ICD-10-CM

## 2017-06-14 DIAGNOSIS — M1A00X0 Idiopathic chronic gout, unspecified site, without tophus (tophi): Secondary | ICD-10-CM | POA: Diagnosis not present

## 2017-06-14 NOTE — Patient Instructions (Signed)

## 2017-06-14 NOTE — Progress Notes (Signed)
HPI:                                                                Drew Cabrera is a 46 y.o. male who [facility] Medicine today for annual physical exam  Current concerns: none  Past Medical History:  Diagnosis Date  . Ganglion and cyst of synovium, tendon and bursa   . Hyperlipidemia   . Hypertension    Past Surgical History:  Procedure Laterality Date  . ORTHOPEDIC SURGERY     Social History   Tobacco Use  . Smoking status: Former Smoker    Types: Cigarettes    Last attempt to quit: 04/21/2008    Years since quitting: 9.1  . Smokeless tobacco: Former Engineer, waterUser  Substance Use Topics  . Alcohol use: No   family history includes Stroke in his father; Thyroid disease in his mother.  ROS: Review of Systems  Eyes: Positive for blurred vision (wears reading glasses).  Musculoskeletal: Positive for back pain and joint pain.  Psychiatric/Behavioral: Positive for depression.  All other systems reviewed and are negative.    Medications: Current Outpatient Medications  Medication Sig Dispense Refill  . allopurinol (ZYLOPRIM) 300 MG tablet TAKE 1 TABLET (300 MG TOTAL) BY MOUTH DAILY. 90 tablet 2  . citalopram (CELEXA) 20 MG tablet Take 1 tablet (20 mg total) by mouth daily. 90 tablet 3  . colchicine 0.6 MG tablet Take 1 tablet (0.6 mg total) by mouth daily. 90 tablet 3  . diclofenac (VOLTAREN) 75 MG EC tablet Take 1 tablet (75 mg total) by mouth 2 (two) times daily. 180 tablet 5  . fenofibrate (TRICOR) 145 MG tablet Take 1 tablet (145 mg total) by mouth daily. 90 tablet 2  . Icosapent Ethyl (VASCEPA) 1 g CAPS Take 1 capsule by mouth 2 (two) times daily. 180 capsule 2  . indomethacin (INDOCIN) 50 MG capsule Take 1 capsule (50 mg total) by mouth 2 (two) times daily with a meal. 60 capsule 0  . levothyroxine (SYNTHROID, LEVOTHROID) 88 MCG tablet TAKE 1 TABLET (88 MCG TOTAL) BY MOUTH DAILY. 90 tablet 2  . lisinopril  (PRINIVIL,ZESTRIL) 20 MG tablet Take 1 tablet (20 mg total) by mouth daily. 90 tablet 3  . magnesium oxide (MAG-OX) 400 MG tablet Take 2 tablets (800 mg total) by mouth at bedtime. 90 tablet 3  . oxyCODONE-acetaminophen (PERCOCET/ROXICET) 5-325 MG tablet Take 1 tablet by mouth every 8 (eight) hours as needed. 30 tablet 0  . pravastatin (PRAVACHOL) 40 MG tablet Take 1 tablet (40 mg total) by mouth daily. 90 tablet 2  . tamsulosin (FLOMAX) 0.4 MG CAPS capsule Take 1 capsule (0.4 mg total) by mouth daily after breakfast. 90 capsule 3  . topiramate (TOPAMAX) 50 MG tablet TAKE 1 TABLET DAILY AT BEDTIME 90 tablet 3  . triamcinolone (KENALOG) 0.1 % paste Use as directed 1 application in the mouth or throat 2 (two) times daily. 15 g 11   No current facility-administered medications for this visit.    Allergies  Allergen Reactions  . Ioxaglate Anaphylaxis  . Metrizamide Shortness Of Breath  . Ivp Dye [Iodinated Diagnostic Agents]        Objective:  BP (!) 144/86 (BP Location: Left Arm, Patient Position:  Sitting, Cuff Size: Large)   Pulse (!) 58   Temp 97.8 F (36.6 C) (Oral)   Resp 16   Wt 193 lb 4.8 oz (87.7 kg)   SpO2 97%   BMI 28.55 kg/m  General Appearance:  Alert, cooperative, no distress, appropriate for age                            Head:  Normocephalic, without obvious abnormality                             Eyes:  PERRL, EOM's intact, conjunctiva and cornea clear                             Ears:  TM pearly gray color and semitransparent, external ear canals normal, both ears                            Nose:  Nares symmetrical                          Throat:  Lips, tongue, and mucosa are moist, pink, and intact; good dentition                             Neck:  Supple; symmetrical, trachea midline, no adenopathy; thyroid: no enlargement, symmetric, no tenderness/mass/nodules                             Back:  Symmetrical, no curvature, ROM normal                                        Lungs:  Clear to auscultation bilaterally, respirations unlabored                             Heart:  regular rate & normal rhythm, S1 and S2 normal, no murmurs, rubs, or gallops                     Abdomen:  Soft, non-tender, no mass or organomegaly              Genitourinary:  deferred         Musculoskeletal:  Tone and strength strong and symmetrical, all extremities; no joint pain or edema, normal gait and station                                      Lymphatic:  No adenopathy             Skin/Hair/Nails:  Skin warm, dry and intact, no rashes or abnormal dyspigmentation on limited exam                   Neurologic:  Alert and oriented x3, no cranial nerve deficits, DTR's intact, sensation grossly intact, normal gait and station Psych: well-groomed, cooperative, good eye contact, euthymic mood, affect mood-congruent, speech is articulate, and thought processes clear and goal-directed   Depression screen [facility] 2/9  06/14/2017 01/30/2017  Decreased Interest 0 3  Down, Depressed, Hopeless 0 0  PHQ - 2 Score 0 3  Altered sleeping - 3  Tired, decreased energy - 2  Change in appetite - 2  Feeling bad or failure about yourself  - 0  Trouble concentrating - 0  Moving slowly or fidgety/restless - 0  Suicidal thoughts - 0  PHQ-9 Score - 10     No results found for this or any previous visit (from the past 72 hour(s)). No results found.    Assessment and Plan: 46 y.o. male with   1. Encounter for routine adult physical exam with abnormal findings - reviewed health maintenance - influenza and Tdap UTD per patient - negative PHQ2  2. Screening for blood disease - CBC  3. Screening for diabetes mellitus - Comprehensive metabolic panel  4. Encounter for lipid screening for cardiovascular disease - Lipid Panel w/reflex Direct LDL  5. Dyslipidemia - continue Pravastatin and Fenofibrate - Lipid Panel w/reflex Direct LDL  6. Encounter for medication monitoring - Uric acid -  TSH  7. Idiopathic chronic gout without tophus, unspecified site - Uric acid  8. Thyroid disease - TSH  9. Elevated blood pressure reading in office with diagnosis of hypertension BP Readings from Last 3 Encounters:  06/14/17 106/67  06/07/17 109/69  04/01/17 109/69  - recheck BP in range   10. Overweight (BMI 25.0-29.9) - counseled on weight loss through decreasing caloric intake and increasing aerobic exercise Wt Readings from Last 3 Encounters:  06/14/17 193 lb 4.8 oz (87.7 kg)  06/07/17 176 lb (79.8 kg)  04/01/17 184 lb (83.5 kg)     Patient education and anticipatory guidance given Patient agrees with treatment plan Follow-up in 1 year for CPE w/fasting labs as needed if symptoms worsen or fail to improve  Levonne Hubertharley E. Genowefa Morga PA-C

## 2017-06-18 LAB — COMPREHENSIVE METABOLIC PANEL
AG Ratio: 2 (calc) (ref 1.0–2.5)
ALT: 30 U/L (ref 9–46)
AST: 20 U/L (ref 10–40)
Albumin: 4.3 g/dL (ref 3.6–5.1)
Alkaline phosphatase (APISO): 97 U/L (ref 40–115)
BUN: 24 mg/dL (ref 7–25)
CO2: 22 mmol/L (ref 20–32)
CREATININE: 1.27 mg/dL (ref 0.60–1.35)
Calcium: 9.1 mg/dL (ref 8.6–10.3)
Chloride: 109 mmol/L (ref 98–110)
GLOBULIN: 2.1 g/dL (ref 1.9–3.7)
GLUCOSE: 82 mg/dL (ref 65–99)
Potassium: 4.2 mmol/L (ref 3.5–5.3)
SODIUM: 139 mmol/L (ref 135–146)
TOTAL PROTEIN: 6.4 g/dL (ref 6.1–8.1)
Total Bilirubin: 0.5 mg/dL (ref 0.2–1.2)

## 2017-06-18 LAB — LIPID PANEL W/REFLEX DIRECT LDL
CHOL/HDL RATIO: 3.8 (calc) (ref <5.0)
Cholesterol: 159 mg/dL (ref <200)
HDL: 42 mg/dL (ref >40)
LDL Cholesterol (Calc): 101 mg/dL (calc) — ABNORMAL HIGH
NON-HDL CHOLESTEROL (CALC): 117 mg/dL (ref <130)
TRIGLYCERIDES: 75 mg/dL (ref <150)

## 2017-06-18 LAB — CBC
HEMATOCRIT: 47.7 % (ref 38.5–50.0)
HEMOGLOBIN: 15.8 g/dL (ref 13.2–17.1)
MCH: 28.4 pg (ref 27.0–33.0)
MCHC: 33.1 g/dL (ref 32.0–36.0)
MCV: 85.6 fL (ref 80.0–100.0)
MPV: 10.3 fL (ref 7.5–12.5)
PLATELETS: 280 10*3/uL (ref 140–400)
RBC: 5.57 10*6/uL (ref 4.20–5.80)
RDW: 13 % (ref 11.0–15.0)
WBC: 7.6 10*3/uL (ref 3.8–10.8)

## 2017-06-18 LAB — TSH: TSH: 3.72 m[IU]/L (ref 0.40–4.50)

## 2017-06-18 LAB — URIC ACID: URIC ACID, SERUM: 5 mg/dL (ref 4.0–8.0)

## 2017-06-19 NOTE — Progress Notes (Signed)
Labs look great No abnormalities Uric acid at goal TSH at goal Cholesterol in healthy range

## 2017-06-28 ENCOUNTER — Other Ambulatory Visit: Payer: Self-pay | Admitting: Physician Assistant

## 2017-07-30 ENCOUNTER — Other Ambulatory Visit: Payer: Self-pay | Admitting: Physician Assistant

## 2017-07-30 DIAGNOSIS — E782 Mixed hyperlipidemia: Secondary | ICD-10-CM

## 2017-10-29 ENCOUNTER — Other Ambulatory Visit: Payer: Self-pay | Admitting: Physician Assistant

## 2017-10-29 DIAGNOSIS — S93601A Unspecified sprain of right foot, initial encounter: Secondary | ICD-10-CM

## 2017-11-27 ENCOUNTER — Ambulatory Visit: Payer: 59 | Admitting: Sports Medicine

## 2017-11-27 ENCOUNTER — Encounter: Payer: Self-pay | Admitting: Sports Medicine

## 2017-11-27 DIAGNOSIS — E663 Overweight: Secondary | ICD-10-CM | POA: Diagnosis not present

## 2017-11-27 DIAGNOSIS — M19171 Post-traumatic osteoarthritis, right ankle and foot: Secondary | ICD-10-CM | POA: Diagnosis not present

## 2017-11-27 DIAGNOSIS — R5383 Other fatigue: Secondary | ICD-10-CM | POA: Diagnosis not present

## 2017-11-27 DIAGNOSIS — S93601A Unspecified sprain of right foot, initial encounter: Secondary | ICD-10-CM | POA: Diagnosis not present

## 2017-11-27 MED ORDER — VORTIOXETINE HBR 10 MG PO TABS: 10.0000 mg | ORAL_TABLET | Freq: Every day | ORAL | 3 refills | Status: DC

## 2017-11-27 MED ORDER — PHENTERMINE HCL 37.5 MG PO TABS: ORAL_TABLET | ORAL | 0 refills | Status: DC

## 2017-11-27 MED ORDER — OXYCODONE-ACETAMINOPHEN 5-325 MG PO TABS: 1.0000 | ORAL_TABLET | Freq: Three times a day (TID) | ORAL | 0 refills | Status: DC | PRN

## 2017-11-27 NOTE — Progress Notes (Signed)
Subjective:    CC: Ankle pain, other issues  HPI: Right ankle osteoarthritis: Desires injection today, pain is severe, persistent localized without radiation.  Mood disorder: Did split with his wife, moderate improvement with citalopram 20 but still having some increasing weight gain, anorgasmia.  Abnormal weight gain: Would like to try 2 months of phentermine.  I reviewed the past medical history, family history, social history, surgical history, and allergies today and no changes were needed.  Please see the problem list section below in epic for further details.  Past Medical History: Past Medical History:  Diagnosis Date  . Ganglion and cyst of synovium, tendon and bursa   . Hyperlipidemia   . Hypertension    Past Surgical History: Past Surgical History:  Procedure Laterality Date  . ORTHOPEDIC SURGERY     Social History: Social History   Socioeconomic History  . Marital status: Single    Spouse name: Not on file  . Number of children: Not on file  . Years of education: Not on file  . Highest education level: Not on file  Occupational History  . Not on file  Social Needs  . Financial resource strain: Not on file  . Food insecurity:    Worry: Not on file    Inability: Not on file  . Transportation needs:    Medical: Not on file    Non-medical: Not on file  Tobacco Use  . Smoking status: Former Smoker    Types: Cigarettes    Last attempt to quit: 04/21/2008    Years since quitting: 9.6  . Smokeless tobacco: Former Engineer, waterUser  Substance and Sexual Activity  . Alcohol use: No  . Drug use: Not on file  . Sexual activity: Yes  Lifestyle  . Physical activity:    Days per week: Not on file    Minutes per session: Not on file  . Stress: Not on file  Relationships  . Social connections:    Talks on phone: Not on file    Gets together: Not on file    Attends religious service: Not on file    Active member of club or organization: Not on file    Attends meetings of  clubs or organizations: Not on file    Relationship status: Not on file  Other Topics Concern  . Not on file  Social History Narrative  . Not on file   Family History: Family History  Problem Relation Age of Onset  . Thyroid disease Mother   . Stroke Father    Allergies: Allergies  Allergen Reactions  . Ioxaglate Anaphylaxis  . Metrizamide Shortness Of Breath  . Ivp Dye [Iodinated Diagnostic Agents]    Medications: See med rec.  Review of Systems: No fevers, chills, night sweats, weight loss, chest pain, or shortness of breath.   Objective:    General: Well Developed, well nourished, and in no acute distress.  Neuro: Alert and oriented x3, extra-ocular muscles intact, sensation grossly intact.  HEENT: Normocephalic, atraumatic, pupils equal round reactive to light, neck supple, no masses, no lymphadenopathy, thyroid nonpalpable.  Skin: Warm and dry, no rashes. Cardiac: Regular rate and rhythm, no murmurs rubs or gallops, no lower extremity edema.  Respiratory: Clear to auscultation bilaterally. Not using accessory muscles, speaking in full sentences.  Procedure: Real-time Ultrasound Guided Injection of right ankle Device: GE Logiq E  Verbal informed consent obtained.  Time-out conducted.  Noted no overlying erythema, induration, or other signs of local infection.  Skin prepped in a  sterile fashion.  Local anesthesia: Topical Ethyl chloride.  With sterile technique and under real time ultrasound guidance: 1 cc: 40, 1 cc lidocaine, 1 cc bupivacaine injected easily. Completed without difficulty  Pain immediately resolved suggesting accurate placement of the medication.  Advised to call if fevers/chills, erythema, induration, drainage, or persistent bleeding.  Images permanently stored and available for review in the ultrasound unit.  Impression: Technically successful ultrasound guided injection.  Impression and Recommendations:    Post-traumatic osteoarthritis of right  ankle Right ankle joint injection, previous injection was about 6 months ago.   Fatigue/Mood Disorder Having some weight gain and anorgasmia with citalopram. Switching to Trintellix. Still dealing with the stress of splitting with his wife.  Overweight (BMI 25.0-29.9) Adding 2 months of phentermine per patient request. ___________________________________________ Drew Cabrera. Benjamin Stain, M.D., ABFM., CAQSM. Primary Care and Sports Medicine [facility]  Adjunct Instructor of Family Medicine  [employer]

## 2017-11-27 NOTE — Assessment & Plan Note (Signed)
Right ankle joint injection, previous injection was about 6 months ago.

## 2017-11-27 NOTE — Assessment & Plan Note (Signed)
Having some weight gain and anorgasmia with citalopram. Switching to Trintellix. Still dealing with the stress of splitting with his wife.

## 2017-11-27 NOTE — Assessment & Plan Note (Signed)
Adding 2 months of phentermine per patient request.

## 2018-01-27 ENCOUNTER — Ambulatory Visit: Payer: 59 | Admitting: Sports Medicine

## 2018-01-27 ENCOUNTER — Encounter: Payer: Self-pay | Admitting: Sports Medicine

## 2018-01-27 VITALS — BP 121/82 | HR 73 | Ht 69.0 in | Wt 187.0 lb

## 2018-01-27 DIAGNOSIS — E669 Obesity, unspecified: Secondary | ICD-10-CM | POA: Diagnosis not present

## 2018-01-27 DIAGNOSIS — R319 Hematuria, unspecified: Secondary | ICD-10-CM

## 2018-01-27 DIAGNOSIS — N529 Male erectile dysfunction, unspecified: Secondary | ICD-10-CM

## 2018-01-27 DIAGNOSIS — E663 Overweight: Secondary | ICD-10-CM | POA: Diagnosis not present

## 2018-01-27 DIAGNOSIS — M19171 Post-traumatic osteoarthritis, right ankle and foot: Secondary | ICD-10-CM

## 2018-01-27 DIAGNOSIS — R5383 Other fatigue: Secondary | ICD-10-CM

## 2018-01-27 DIAGNOSIS — N410 Acute prostatitis: Secondary | ICD-10-CM

## 2018-01-27 DIAGNOSIS — S93601A Unspecified sprain of right foot, initial encounter: Secondary | ICD-10-CM

## 2018-01-27 LAB — POCT URINALYSIS DIPSTICK
Bilirubin, UA: NEGATIVE
Glucose, UA: NEGATIVE
Ketones, UA: NEGATIVE
Leukocytes, UA: NEGATIVE
Nitrite, UA: NEGATIVE
Protein, UA: NEGATIVE
Spec Grav, UA: 1.02 (ref 1.010–1.025)
Urobilinogen, UA: 1 U/dL
pH, UA: 7 (ref 5.0–8.0)

## 2018-01-27 MED ORDER — VORTIOXETINE HBR 20 MG PO TABS: 20.0000 mg | ORAL_TABLET | Freq: Every day | ORAL | 3 refills | Status: DC

## 2018-01-27 MED ORDER — SILDENAFIL CITRATE 20 MG PO TABS: 20.0000 mg | ORAL_TABLET | ORAL | 11 refills | Status: AC | PRN

## 2018-01-27 MED ORDER — OXYCODONE-ACETAMINOPHEN 5-325 MG PO TABS: 1.0000 | ORAL_TABLET | Freq: Three times a day (TID) | ORAL | 0 refills | Status: DC | PRN

## 2018-01-27 MED ORDER — PHENTERMINE HCL 37.5 MG PO TABS: ORAL_TABLET | ORAL | 0 refills | Status: DC

## 2018-01-27 NOTE — Assessment & Plan Note (Signed)
Anorgasmia resolved with Trintellix, increasing to 20 mg, mood is okay now.

## 2018-01-27 NOTE — Assessment & Plan Note (Signed)
Adding generic Viagra.

## 2018-01-27 NOTE — Progress Notes (Signed)
Subjective:    CC: Multiple issues  HPI: This is a pleasant 47 year old male, lately he has been having some difficulty with duration of erection, he is able to achieve a solid erection but it does not last long enough.  He is in a new relationship and is having much more sex than historically.  Weight loss: Would like to continue phentermine.  Mood disorder: Did well on Trintellix at 10 mg, would like to increase to 20 mg, no anorgasmia.  He also had an episode of blood in his ejaculate.  This never occurred again, there was no pain, history of prostatitis and nephrolithiasis.  I reviewed the past medical history, family history, social history, surgical history, and allergies today and no changes were needed.  Please see the problem list section below in epic for further details.  Past Medical History: Past Medical History:  Diagnosis Date  . Ganglion and cyst of synovium, tendon and bursa   . Hyperlipidemia   . Hypertension    Past Surgical History: Past Surgical History:  Procedure Laterality Date  . ORTHOPEDIC SURGERY     Social History: Social History   Socioeconomic History  . Marital status: Single    Spouse name: Not on file  . Number of children: Not on file  . Years of education: Not on file  . Highest education level: Not on file  Occupational History  . Not on file  Social Needs  . Financial resource strain: Not on file  . Food insecurity:    Worry: Not on file    Inability: Not on file  . Transportation needs:    Medical: Not on file    Non-medical: Not on file  Tobacco Use  . Smoking status: Former Smoker    Types: Cigarettes    Last attempt to quit: 04/21/2008    Years since quitting: 9.7  . Smokeless tobacco: Former Engineer, waterUser  Substance and Sexual Activity  . Alcohol use: No  . Drug use: Not on file  . Sexual activity: Yes  Lifestyle  . Physical activity:    Days per week: Not on file    Minutes per session: Not on file  . Stress: Not on file    Relationships  . Social connections:    Talks on phone: Not on file    Gets together: Not on file    Attends religious service: Not on file    Active member of club or organization: Not on file    Attends meetings of clubs or organizations: Not on file    Relationship status: Not on file  Other Topics Concern  . Not on file  Social History Narrative  . Not on file   Family History: Family History  Problem Relation Age of Onset  . Thyroid disease Mother   . Stroke Father    Allergies: Allergies  Allergen Reactions  . Ioxaglate Anaphylaxis  . Metrizamide Shortness Of Breath  . Ivp Dye [Iodinated Diagnostic Agents]    Medications: See med rec.  Review of Systems: No fevers, chills, night sweats, weight loss, chest pain, or shortness of breath.   Objective:    General: Well Developed, well nourished, and in no acute distress.  Neuro: Alert and oriented x3, extra-ocular muscles intact, sensation grossly intact.  HEENT: Normocephalic, atraumatic, pupils equal round reactive to light, neck supple, no masses, no lymphadenopathy, thyroid nonpalpable.  Skin: Warm and dry, no rashes. Cardiac: Regular rate and rhythm, no murmurs rubs or gallops, no lower extremity edema.  Respiratory: Clear to auscultation bilaterally. Not using accessory muscles, speaking in full sentences.  Impression and Recommendations:    Prostatitis History of recurrent prostatitis, this resolved after Cipro. Flomax did improve his urination during his episode of prostatitis as well. He has noted a single episode of blood in his ejaculate, checking a urinalysis today. Urinalysis is positive for blood, adding a renal ultrasound. If this recurs we will proceed with transrectal prostate and seminal vesicular ultrasound.   Obesity Refilling phentermine.  Fatigue/Mood Disorder Anorgasmia resolved with Trintellix, increasing to 20 mg, mood is okay now.  Erectile dysfunction Adding generic  Viagra.  ___________________________________________ Ihor Austinhomas J. Benjamin Stainhekkekandam, M.D., ABFM., CAQSM. Primary Care and Sports Medicine [facility]  Adjunct Instructor of Family Medicine  [employer]

## 2018-01-27 NOTE — Assessment & Plan Note (Signed)
Refilling phentermine. 

## 2018-01-27 NOTE — Assessment & Plan Note (Addendum)
History of recurrent prostatitis, this resolved after Cipro. Flomax did improve his urination during his episode of prostatitis as well. He has noted a single episode of blood in his ejaculate, checking a urinalysis today. Urinalysis is positive for blood, adding a renal ultrasound. If this recurs we will proceed with transrectal prostate and seminal vesicular ultrasound.

## 2018-01-28 LAB — URINE CULTURE
MICRO NUMBER:: 90954494
Result:: NO GROWTH
SPECIMEN QUALITY:: ADEQUATE

## 2018-01-30 ENCOUNTER — Ambulatory Visit (INDEPENDENT_AMBULATORY_CARE_PROVIDER_SITE_OTHER): Payer: 59

## 2018-01-30 DIAGNOSIS — N200 Calculus of kidney: Secondary | ICD-10-CM

## 2018-03-05 ENCOUNTER — Other Ambulatory Visit: Payer: Self-pay | Admitting: Sports Medicine

## 2018-03-31 ENCOUNTER — Ambulatory Visit: Payer: Self-pay | Admitting: Sports Medicine

## 2018-04-14 ENCOUNTER — Other Ambulatory Visit: Payer: Self-pay | Admitting: Sports Medicine

## 2018-04-14 DIAGNOSIS — E663 Overweight: Secondary | ICD-10-CM

## 2018-04-14 DIAGNOSIS — E669 Obesity, unspecified: Secondary | ICD-10-CM

## 2018-06-03 ENCOUNTER — Ambulatory Visit: Payer: 59 | Admitting: Sports Medicine

## 2018-06-03 ENCOUNTER — Encounter: Payer: Self-pay | Admitting: Sports Medicine

## 2018-06-03 DIAGNOSIS — I10 Essential (primary) hypertension: Secondary | ICD-10-CM

## 2018-06-03 DIAGNOSIS — E669 Obesity, unspecified: Secondary | ICD-10-CM | POA: Diagnosis not present

## 2018-06-03 DIAGNOSIS — M19171 Post-traumatic osteoarthritis, right ankle and foot: Secondary | ICD-10-CM | POA: Diagnosis not present

## 2018-06-03 DIAGNOSIS — E7800 Pure hypercholesterolemia, unspecified: Secondary | ICD-10-CM

## 2018-06-03 DIAGNOSIS — S93601A Unspecified sprain of right foot, initial encounter: Secondary | ICD-10-CM

## 2018-06-03 DIAGNOSIS — E782 Mixed hyperlipidemia: Secondary | ICD-10-CM

## 2018-06-03 MED ORDER — ALLOPURINOL 300 MG PO TABS: 300.0000 mg | ORAL_TABLET | Freq: Every day | ORAL | 3 refills | Status: DC

## 2018-06-03 MED ORDER — FENOFIBRATE 145 MG PO TABS: 145.0000 mg | ORAL_TABLET | Freq: Every day | ORAL | 3 refills | Status: DC

## 2018-06-03 MED ORDER — OXYCODONE-ACETAMINOPHEN 5-325 MG PO TABS: 1.0000 | ORAL_TABLET | Freq: Three times a day (TID) | ORAL | 0 refills | Status: DC | PRN

## 2018-06-03 MED ORDER — LEVOTHYROXINE SODIUM 88 MCG PO TABS: 88.0000 ug | ORAL_TABLET | Freq: Every day | ORAL | 11 refills | Status: DC

## 2018-06-03 MED ORDER — PRAVASTATIN SODIUM 40 MG PO TABS: 40.0000 mg | ORAL_TABLET | Freq: Every day | ORAL | 3 refills | Status: DC

## 2018-06-03 MED ORDER — LISINOPRIL 20 MG PO TABS: 20.0000 mg | ORAL_TABLET | Freq: Every day | ORAL | 3 refills | Status: DC

## 2018-06-03 MED ORDER — TOPIRAMATE 50 MG PO TABS: 50.0000 mg | ORAL_TABLET | Freq: Every day | ORAL | 3 refills | Status: DC

## 2018-06-03 MED ORDER — ICOSAPENT ETHYL 1 G PO CAPS: 1.0000 | ORAL_CAPSULE | Freq: Two times a day (BID) | ORAL | 11 refills | Status: DC

## 2018-06-03 NOTE — Progress Notes (Signed)
Subjective:    CC: Ankle pain  HPI: This is a pleasant 47 year old male, he has right posttraumatic ankle osteoarthritis, previous injection was 6 months ago, having recurrence of pain, severe, persistent, localized without radiation, desires interventional treatment today.  Gout: Needs refills on medications.  Hyperlipidemia: Should be on pravastatin, fenofibrate, the Cipro, needs to refill medications.  Hypertension: Uncontrolled, not taking his lisinopril.  I reviewed the past medical history, family history, social history, surgical history, and allergies today and no changes were needed.  Please see the problem list section below in epic for further details.  Past Medical History: Past Medical History:  Diagnosis Date  . Ganglion and cyst of synovium, tendon and bursa   . Hyperlipidemia   . Hypertension    Past Surgical History: Past Surgical History:  Procedure Laterality Date  . ORTHOPEDIC SURGERY     Social History: Social History   Socioeconomic History  . Marital status: Single    Spouse name: Not on file  . Number of children: Not on file  . Years of education: Not on file  . Highest education level: Not on file  Occupational History  . Not on file  Social Needs  . Financial resource strain: Not on file  . Food insecurity:    Worry: Not on file    Inability: Not on file  . Transportation needs:    Medical: Not on file    Non-medical: Not on file  Tobacco Use  . Smoking status: Former Smoker    Types: Cigarettes    Last attempt to quit: 04/21/2008    Years since quitting: 10.1  . Smokeless tobacco: Former Engineer, water and Sexual Activity  . Alcohol use: No  . Drug use: Not on file  . Sexual activity: Yes  Lifestyle  . Physical activity:    Days per week: Not on file    Minutes per session: Not on file  . Stress: Not on file  Relationships  . Social connections:    Talks on phone: Not on file    Gets together: Not on file    Attends  religious service: Not on file    Active member of club or organization: Not on file    Attends meetings of clubs or organizations: Not on file    Relationship status: Not on file  Other Topics Concern  . Not on file  Social History Narrative  . Not on file   Family History: Family History  Problem Relation Age of Onset  . Thyroid disease Mother   . Stroke Father    Allergies: Allergies  Allergen Reactions  . Ioxaglate Anaphylaxis  . Metrizamide Shortness Of Breath  . Ivp Dye [Iodinated Diagnostic Agents]    Medications: See med rec.  Review of Systems: No fevers, chills, night sweats, weight loss, chest pain, or shortness of breath.   Objective:    General: Well Developed, well nourished, and in no acute distress.  Neuro: Alert and oriented x3, extra-ocular muscles intact, sensation grossly intact.  HEENT: Normocephalic, atraumatic, pupils equal round reactive to light, neck supple, no masses, no lymphadenopathy, thyroid nonpalpable.  Skin: Warm and dry, no rashes. Cardiac: Regular rate and rhythm, no murmurs rubs or gallops, no lower extremity edema.  Respiratory: Clear to auscultation bilaterally. Not using accessory muscles, speaking in full sentences.  Procedure: Real-time Ultrasound Guided Injection of right ankle joint Device: GE Logiq E  Verbal informed consent obtained.  Time-out conducted.  Noted no overlying erythema, induration, or  other signs of local infection.  Skin prepped in a sterile fashion.  Local anesthesia: Topical Ethyl chloride.  With sterile technique and under real time ultrasound guidance: 1 cc Kenalog 40, 1 cc lidocaine, 1 cc bupivacaine injected easily Completed without difficulty  Pain immediately resolved suggesting accurate placement of the medication.  Advised to call if fevers/chills, erythema, induration, drainage, or persistent bleeding.  Images permanently stored and available for review in the ultrasound unit.  Impression:  Technically successful ultrasound guided injection.  Impression and Recommendations:    Post-traumatic osteoarthritis of right ankle Repeat injection today, previous injection was 6 months ago.  Hypertension Uncontrolled blood pressure but off of medications. ___________________________________________ Ihor Austinhomas J. Benjamin Stainhekkekandam, M.D., ABFM., CAQSM. Primary Care and Sports Medicine [facility]  Adjunct Professor of Family Medicine  [employer]

## 2018-06-03 NOTE — Assessment & Plan Note (Signed)
Repeat injection today, previous injection was 6 months ago.

## 2018-06-03 NOTE — Assessment & Plan Note (Signed)
Uncontrolled blood pressure but off of medications.

## 2018-06-23 ENCOUNTER — Ambulatory Visit: Payer: 59 | Admitting: Sports Medicine

## 2018-06-23 ENCOUNTER — Encounter: Payer: Self-pay | Admitting: Sports Medicine

## 2018-06-23 VITALS — BP 136/78 | HR 93 | Temp 97.6°F | Ht 69.0 in | Wt 194.0 lb

## 2018-06-23 DIAGNOSIS — R82998 Other abnormal findings in urine: Secondary | ICD-10-CM | POA: Diagnosis not present

## 2018-06-23 DIAGNOSIS — N410 Acute prostatitis: Secondary | ICD-10-CM | POA: Diagnosis not present

## 2018-06-23 DIAGNOSIS — R3915 Urgency of urination: Secondary | ICD-10-CM | POA: Diagnosis not present

## 2018-06-23 LAB — POCT URINALYSIS DIPSTICK
Bilirubin, UA: NEGATIVE
Blood, UA: NEGATIVE
Glucose, UA: NEGATIVE
Ketones, UA: NEGATIVE
Nitrite, UA: NEGATIVE
Protein, UA: NEGATIVE
Spec Grav, UA: 1.02 (ref 1.010–1.025)
Urobilinogen, UA: 2 E.U./dL — AB
pH, UA: 7 (ref 5.0–8.0)

## 2018-06-23 MED ORDER — TAMSULOSIN HCL 0.4 MG PO CAPS: 0.4000 mg | ORAL_CAPSULE | Freq: Every day | ORAL | 3 refills | Status: DC

## 2018-06-23 MED ORDER — CIPROFLOXACIN HCL 750 MG PO TABS: 750.0000 mg | ORAL_TABLET | Freq: Two times a day (BID) | ORAL | 0 refills | Status: AC

## 2018-06-23 NOTE — Patient Instructions (Signed)
Prostatitis    Prostatitis is swelling or inflammation of the prostate gland. The prostate is a walnut-sized gland that is involved in the production of semen. It is located below a man's bladder, in front of the rectum.  There are four types of prostatitis:  · Chronic nonbacterial prostatitis. This is the most common type of prostatitis. It may be associated with a viral infection or autoimmune disorder.  · Acute bacterial prostatitis. This is the least common type of prostatitis. It starts quickly and is usually associated with a bladder infection, high fever, and shaking chills. It can occur at any age.  · Chronic bacterial prostatitis. This type usually results from acute bacterial prostatitis that happens repeatedly (is recurrent) or has not been treated properly. It can occur in men of any age but is most common among middle-aged men whose prostate has begun to get larger. The symptoms are not as severe as symptoms caused by acute bacterial prostatitis.  · Prostatodynia or chronic pelvic pain syndrome (CPPS). This type is also called pelvic floor disorder. It is associated with increased muscular tone in the pelvis surrounding the prostate.  What are the causes?  Bacterial prostatitis is caused by infection from bacteria. Chronic nonbacterial prostatitis may be caused by:  · Urinary tract infections (UTIs).  · Nerve damage.  · A response by the body’s disease-fighting system (autoimmune response).  · Chemicals in the urine.  The causes of the other types of prostatitis are usually not known.  What are the signs or symptoms?  Symptoms of this condition vary depending upon the type of prostatitis. If you have acute bacterial prostatitis, you may experience:  · Urinary symptoms, such as:  ? Painful urination.  ? Burning during urination.  ? Frequent and sudden urges to urinate.  ? Inability to start urinating.  ? A weak or interrupted stream of urine.  · Vomiting.  · Nausea.  · Fever.  · Chills.  · Inability to  empty the bladder completely.  · Pain in the:  ? Muscles or joints.  ? Lower back.  ? Lower abdomen.  If you have any of the other types of prostatitis, you may experience:  · Urinary symptoms, such as:  ? Sudden urges to urinate.  ? Frequent urination.  ? Difficulty starting urination.  ? Weak urine stream.  ? Dribbling after urination.  · Discharge from the urethra. The urethra is a tube that opens at the end of the penis.  · Pain in the:  ? Testicles.  ? Penis or tip of the penis.  ? Rectum.  ? Area in front of the rectum and below the scrotum (perineum).  · Problems with sexual function.  · Painful ejaculation.  · Bloody semen.  How is this diagnosed?  This condition may be diagnosed based on:  · A physical and medical exam.  · Your symptoms.  · A urine test to check for bacteria.  · An exam in which a health care provider uses a finger to feel the prostate (digital rectal exam).  · A test of a sample of semen.  · Blood tests.  · Ultrasound.  · Removal of prostate tissue to be examined under a microscope (biopsy).  · Tests to check how your body handles urine (urodynamic tests).  · A test to look inside your bladder or urethra (cystoscopy).  How is this treated?  Treatment for this condition depends on the type of prostatitis. Treatment may involve:  · Medicines to relieve   pain or inflammation.  · Medicines to help relax your muscles.  · Physical therapy.  · Heat therapy.  · Techniques to help you control certain body functions (biofeedback).  · Relaxation exercises.  · Antibiotic medicine, if your condition is caused by bacteria.  · Warm water baths (sitz baths). Sitz baths help with relaxing your pelvic floor muscles, which helps to relieve pressure on the prostate.  Follow these instructions at home:    · Take over-the-counter and prescription medicines only as told by your health care provider.  · If you were prescribed an antibiotic, take it as told by your health care provider. Do not stop taking the  antibiotic even if you start to feel better.  · If physical therapy, biofeedback, or relaxation exercises were prescribed, do exercises as instructed.  · Take sitz baths as directed by your health care provider. For a sitz bath, sit in warm water that is deep enough to cover your hips and buttocks.  · Keep all follow-up visits as told by your health care provider. This is important.  Contact a health care provider if:  · Your symptoms get worse.  · You have a fever.  Get help right away if:  · You have chills.  · You feel nauseous.  · You vomit.  · You feel light-headed or feel like you are going to faint.  · You are unable to urinate.  · You have blood or blood clots in your urine.  This information is not intended to replace advice given to you by your health care provider. Make sure you discuss any questions you have with your health care provider.  Document Released: 06/01/2000 Document Revised: 08/17/2017 Document Reviewed: 02/23/2016  Elsevier Interactive Patient Education © 2019 Elsevier Inc.

## 2018-06-23 NOTE — Assessment & Plan Note (Signed)
Recurrence of prostatitis, historically this has resolved after Cipro. We have used Flomax as well in the past. Urinalysis is positive for leukocytes, he does have hesitancy, urgency, frequency, dribbling, decreased volume of ejaculate. Adding Cipro for 2 weeks, Flomax. Urinalysis will be cultured.

## 2018-06-23 NOTE — Progress Notes (Signed)
Subjective:    CC: Urinary frequency  HPI: For the past few days this pleasant 48 year old male with a history of recurrent prostatitis has had increasing urinary frequency, urgency, hesitancy and dribbling.  Minimal burning.  No fevers, chills, no back pain, he did recently pass a kidney stone.  I reviewed the past medical history, family history, social history, surgical history, and allergies today and no changes were needed.  Please see the problem list section below in epic for further details.  Past Medical History: Past Medical History:  Diagnosis Date  . Ganglion and cyst of synovium, tendon and bursa   . Hyperlipidemia   . Hypertension    Past Surgical History: Past Surgical History:  Procedure Laterality Date  . ORTHOPEDIC SURGERY     Social History: Social History   Socioeconomic History  . Marital status: Single    Spouse name: Not on file  . Number of children: Not on file  . Years of education: Not on file  . Highest education level: Not on file  Occupational History  . Not on file  Social Needs  . Financial resource strain: Not on file  . Food insecurity:    Worry: Not on file    Inability: Not on file  . Transportation needs:    Medical: Not on file    Non-medical: Not on file  Tobacco Use  . Smoking status: Former Smoker    Types: Cigarettes    Last attempt to quit: 04/21/2008    Years since quitting: 10.1  . Smokeless tobacco: Former Engineer, water and Sexual Activity  . Alcohol use: No  . Drug use: Not on file  . Sexual activity: Yes  Lifestyle  . Physical activity:    Days per week: Not on file    Minutes per session: Not on file  . Stress: Not on file  Relationships  . Social connections:    Talks on phone: Not on file    Gets together: Not on file    Attends religious service: Not on file    Active member of club or organization: Not on file    Attends meetings of clubs or organizations: Not on file    Relationship status: Not on  file  Other Topics Concern  . Not on file  Social History Narrative  . Not on file   Family History: Family History  Problem Relation Age of Onset  . Thyroid disease Mother   . Stroke Father    Allergies: Allergies  Allergen Reactions  . Ioxaglate Anaphylaxis  . Metrizamide Shortness Of Breath  . Ivp Dye [Iodinated Diagnostic Agents]    Medications: See med rec.  Review of Systems: No fevers, chills, night sweats, weight loss, chest pain, or shortness of breath.   Objective:    General: Well Developed, well nourished, and in no acute distress.  Neuro: Alert and oriented x3, extra-ocular muscles intact, sensation grossly intact.  HEENT: Normocephalic, atraumatic, pupils equal round reactive to light, neck supple, no masses, no lymphadenopathy, thyroid nonpalpable.  Skin: Warm and dry, no rashes. Cardiac: Regular rate and rhythm, no murmurs rubs or gallops, no lower extremity edema.  Respiratory: Clear to auscultation bilaterally. Not using accessory muscles, speaking in full sentences. Abdomen: Soft, nontender, nondistended, normal bowel sounds, no palpable masses, no guarding, rigidity, rebound tenderness.  Urinalysis is positive for leukocytes.  Impression and Recommendations:    Prostatitis Recurrence of prostatitis, historically this has resolved after Cipro. We have used Flomax as well in  the past. Urinalysis is positive for leukocytes, he does have hesitancy, urgency, frequency, dribbling, decreased volume of ejaculate. Adding Cipro for 2 weeks, Flomax. Urinalysis will be cultured. ___________________________________________ Ihor Austin. Benjamin Stain, M.D., ABFM., CAQSM. Primary Care and Sports Medicine [facility]  Adjunct Professor of Family Medicine  [employer]

## 2018-06-25 LAB — URINE CULTURE
MICRO NUMBER:: 18588
Result:: NO GROWTH
SPECIMEN QUALITY:: ADEQUATE

## 2018-06-30 ENCOUNTER — Encounter: Payer: Self-pay | Admitting: Sports Medicine

## 2018-06-30 ENCOUNTER — Ambulatory Visit: Payer: 59 | Admitting: Sports Medicine

## 2018-06-30 VITALS — BP 112/72 | HR 75 | Temp 97.9°F

## 2018-06-30 DIAGNOSIS — N410 Acute prostatitis: Secondary | ICD-10-CM

## 2018-06-30 DIAGNOSIS — R319 Hematuria, unspecified: Secondary | ICD-10-CM | POA: Diagnosis not present

## 2018-06-30 DIAGNOSIS — N3289 Other specified disorders of bladder: Secondary | ICD-10-CM | POA: Diagnosis not present

## 2018-06-30 LAB — POCT URINALYSIS DIPSTICK
Bilirubin, UA: NEGATIVE
Glucose, UA: NEGATIVE
Ketones, UA: NEGATIVE
Nitrite, UA: NEGATIVE
Protein, UA: NEGATIVE
Spec Grav, UA: >=1.03 — AB (ref 1.010–1.025)
Urobilinogen, UA: 0.2 E.U./dL
pH, UA: 5.5 (ref 5.0–8.0)

## 2018-06-30 MED ORDER — PREDNISONE 50 MG PO TABS: 50.0000 mg | ORAL_TABLET | Freq: Every day | ORAL | 0 refills | Status: DC

## 2018-06-30 MED ORDER — AMPICILLIN 500 MG PO CAPS: 500.0000 mg | ORAL_CAPSULE | Freq: Four times a day (QID) | ORAL | 0 refills | Status: AC

## 2018-06-30 NOTE — Assessment & Plan Note (Addendum)
Persistent severe pain. Historically this has resolved with Cipro. I added Flomax as well at the last visit, he tried to stop Flomax but had severe worsening of symptoms so he doubled it 0.8 mg. Continue Cipro, adding ampicillin. Urinary tract ultrasound. With penile pain we are also going to do gonorrhea and Chlamydia testing. Urinalysis had blood and leukocytes, running a regular culture off of this as well.  There is a 1.2 cm bladder stone, no stones in the kidneys, ureters.  There is also a shadow in the bladder, so we do need to proceed with a CT with and without contrast to ensure that this is not a bladder mass.  I am also going to do a referral to urology for additional evaluation.  Plan does not change, continue antibiotics for now.

## 2018-06-30 NOTE — Progress Notes (Addendum)
Subjective:    CC: Persistent pain  HPI: This is a pleasant 48 year old male, I saw him last week and diagnosed him with an acute prostatitis.  We started Cipro, Flomax.  Unfortunately he is having a worsening of symptoms, severe pain in the groin, tip of the penis without penile discharge, dysuria, hesitancy, urgency.  Symptoms are moderate, persistent, no fevers, chills.  He did try stopping his Flomax and realized this was a bad idea so he went up to 0.8 milligrams.  I reviewed the past medical history, family history, social history, surgical history, and allergies today and no changes were needed.  Please see the problem list section below in epic for further details.  Past Medical History: Past Medical History:  Diagnosis Date  . Ganglion and cyst of synovium, tendon and bursa   . Hyperlipidemia   . Hypertension    Past Surgical History: Past Surgical History:  Procedure Laterality Date  . ORTHOPEDIC SURGERY     Social History: Social History   Socioeconomic History  . Marital status: Single    Spouse name: Not on file  . Number of children: Not on file  . Years of education: Not on file  . Highest education level: Not on file  Occupational History  . Not on file  Social Needs  . Financial resource strain: Not on file  . Food insecurity:    Worry: Not on file    Inability: Not on file  . Transportation needs:    Medical: Not on file    Non-medical: Not on file  Tobacco Use  . Smoking status: Former Smoker    Types: Cigarettes    Last attempt to quit: 04/21/2008    Years since quitting: 10.2  . Smokeless tobacco: Former Engineer, water and Sexual Activity  . Alcohol use: No  . Drug use: Not on file  . Sexual activity: Yes  Lifestyle  . Physical activity:    Days per week: Not on file    Minutes per session: Not on file  . Stress: Not on file  Relationships  . Social connections:    Talks on phone: Not on file    Gets together: Not on file    Attends  religious service: Not on file    Active member of club or organization: Not on file    Attends meetings of clubs or organizations: Not on file    Relationship status: Not on file  Other Topics Concern  . Not on file  Social History Narrative  . Not on file   Family History: Family History  Problem Relation Age of Onset  . Thyroid disease Mother   . Stroke Father    Allergies: Allergies  Allergen Reactions  . Ioxaglate Anaphylaxis  . Metrizamide Shortness Of Breath  . Ivp Dye [Iodinated Diagnostic Agents]    Medications: See med rec.  Review of Systems: No fevers, chills, night sweats, weight loss, chest pain, or shortness of breath.   Objective:    General: Well Developed, well nourished, and in no acute distress.  Neuro: Alert and oriented x3, extra-ocular muscles intact, sensation grossly intact.  HEENT: Normocephalic, atraumatic, pupils equal round reactive to light, neck supple, no masses, no lymphadenopathy, thyroid nonpalpable.  Skin: Warm and dry, no rashes. Cardiac: Regular rate and rhythm, no murmurs rubs or gallops, no lower extremity edema.  Respiratory: Clear to auscultation bilaterally. Not using accessory muscles, speaking in full sentences. Abdomen: Soft, nontender, nondistended, normal bowel sounds, no palpable masses,  no guarding, rigidity, rebound tenderness, no costovertebral angle pain.  Urinalysis is positive for blood and leukocytes.  Impression and Recommendations:    Prostatitis Persistent severe pain. Historically this has resolved with Cipro. I added Flomax as well at the last visit, he tried to stop Flomax but had severe worsening of symptoms so he doubled it 0.8 mg. Continue Cipro, adding ampicillin. Urinary tract ultrasound. With penile pain we are also going to do gonorrhea and Chlamydia testing. Urinalysis had blood and leukocytes, running a regular culture off of this as well.  There is a 1.2 cm bladder stone, no stones in the kidneys,  ureters.  There is also a shadow in the bladder, so we do need to proceed with a CT with and without contrast to ensure that this is not a bladder mass.  I am also going to do a referral to urology for additional evaluation.  Plan does not change, continue antibiotics for now.   Mass of bladder There is a 1.2 cm bladder stone, no stones in the kidneys, ureters.  There is also a shadow in the bladder, so we do need to proceed with a CT with and without contrast to ensure that this is not a bladder mass.  I am also going to do a referral to urology for additional evaluation.  Plan does not change, continue antibiotics for now. ___________________________________________ Ihor Austin. Benjamin Stain, M.D., ABFM., CAQSM. Primary Care and Sports Medicine [facility]  Adjunct Professor of Family Medicine  [employer]

## 2018-07-01 ENCOUNTER — Ambulatory Visit (HOSPITAL_BASED_OUTPATIENT_CLINIC_OR_DEPARTMENT_OTHER)
Admission: RE | Admit: 2018-07-01 | Discharge: 2018-07-01 | Disposition: A | Discharge status: Home / Self Care | Payer: 59 | Source: Ambulatory Visit | Attending: Sports Medicine | Admitting: Sports Medicine

## 2018-07-01 ENCOUNTER — Other Ambulatory Visit: Payer: Self-pay | Admitting: Sports Medicine

## 2018-07-01 DIAGNOSIS — N3289 Other specified disorders of bladder: Secondary | ICD-10-CM | POA: Insufficient documentation

## 2018-07-01 DIAGNOSIS — N410 Acute prostatitis: Secondary | ICD-10-CM | POA: Diagnosis not present

## 2018-07-01 LAB — URINE CULTURE
MICRO NUMBER:: 46696
Result:: NO GROWTH
SPECIMEN QUALITY:: ADEQUATE

## 2018-07-01 LAB — C. TRACHOMATIS/N. GONORRHOEAE RNA
C. trachomatis RNA, TMA: NOT DETECTED
N. gonorrhoeae RNA, TMA: NOT DETECTED

## 2018-07-01 NOTE — Addendum Note (Signed)
Addended by: Monica Becton on: 07/01/2018 12:01 PM   Modules accepted: Orders

## 2018-07-01 NOTE — Assessment & Plan Note (Signed)
There is a 1.2 cm bladder stone, no stones in the kidneys, ureters.  There is also a shadow in the bladder, so we do need to proceed with a CT with and without contrast to ensure that this is not a bladder mass.  I am also going to do a referral to urology for additional evaluation.  Plan does not change, continue antibiotics for now.

## 2018-07-04 ENCOUNTER — Telehealth: Payer: Self-pay | Admitting: Sports Medicine

## 2018-07-04 DIAGNOSIS — I10 Essential (primary) hypertension: Secondary | ICD-10-CM

## 2018-07-04 NOTE — Telephone Encounter (Signed)
Lab order placed per radiology protocol.  

## 2018-07-09 ENCOUNTER — Ambulatory Visit (INDEPENDENT_AMBULATORY_CARE_PROVIDER_SITE_OTHER): Payer: 59

## 2018-07-09 ENCOUNTER — Telehealth: Payer: Self-pay | Admitting: Sports Medicine

## 2018-07-09 ENCOUNTER — Telehealth: Payer: Self-pay

## 2018-07-09 DIAGNOSIS — N3289 Other specified disorders of bladder: Secondary | ICD-10-CM | POA: Diagnosis not present

## 2018-07-09 LAB — COMPREHENSIVE METABOLIC PANEL WITH GFR
ALT: 73 U/L — ABNORMAL HIGH (ref 9–46)
Calcium: 9.4 mg/dL (ref 8.6–10.3)
Total Bilirubin: 0.5 mg/dL (ref 0.2–1.2)

## 2018-07-09 LAB — COMPREHENSIVE METABOLIC PANEL
AG Ratio: 2 (calc) (ref 1.0–2.5)
AST: 32 U/L (ref 10–40)
Albumin: 4.3 g/dL (ref 3.6–5.1)
Alkaline phosphatase (APISO): 100 U/L (ref 40–115)
BUN: 15 mg/dL (ref 7–25)
CO2: 26 mmol/L (ref 20–32)
Chloride: 104 mmol/L (ref 98–110)
Creat: 1.3 mg/dL (ref 0.60–1.35)
Globulin: 2.2 g/dL (calc) (ref 1.9–3.7)
Glucose, Bld: 124 mg/dL — ABNORMAL HIGH (ref 65–99)
Potassium: 3.7 mmol/L (ref 3.5–5.3)
Sodium: 140 mmol/L (ref 135–146)
Total Protein: 6.5 g/dL (ref 6.1–8.1)

## 2018-07-09 MED ORDER — IOPAMIDOL (ISOVUE-300) INJECTION 61%
100.0000 mL | Freq: Once | INTRAVENOUS | Status: AC | PRN
  Administered 2018-07-09: 100 mL via INTRAVENOUS

## 2018-07-09 NOTE — Telephone Encounter (Signed)
Pt advised, see result notes.

## 2018-07-09 NOTE — Telephone Encounter (Signed)
PT would like to know the results of his scans ASAP

## 2018-07-09 NOTE — Telephone Encounter (Signed)
Patient came by after CT and really would like for you to call him TODAY with results so he is not worrying [phone]

## 2018-07-21 ENCOUNTER — Ambulatory Visit (INDEPENDENT_AMBULATORY_CARE_PROVIDER_SITE_OTHER): Payer: 59 | Admitting: Sports Medicine

## 2018-07-21 ENCOUNTER — Ambulatory Visit: Payer: 59 | Admitting: Sports Medicine

## 2018-07-21 ENCOUNTER — Encounter: Payer: Self-pay | Admitting: Sports Medicine

## 2018-07-21 DIAGNOSIS — E669 Obesity, unspecified: Secondary | ICD-10-CM

## 2018-07-21 DIAGNOSIS — S93601A Unspecified sprain of right foot, initial encounter: Secondary | ICD-10-CM | POA: Diagnosis not present

## 2018-07-21 DIAGNOSIS — N410 Acute prostatitis: Secondary | ICD-10-CM | POA: Diagnosis not present

## 2018-07-21 DIAGNOSIS — M19171 Post-traumatic osteoarthritis, right ankle and foot: Secondary | ICD-10-CM

## 2018-07-21 MED ORDER — NALTREXONE-BUPROPION HCL ER 8-90 MG PO TB12: ORAL_TABLET | ORAL | 0 refills | Status: DC

## 2018-07-21 MED ORDER — OXYCODONE-ACETAMINOPHEN 5-325 MG PO TABS: 1.0000 | ORAL_TABLET | Freq: Three times a day (TID) | ORAL | 0 refills | Status: DC | PRN

## 2018-07-21 MED ORDER — NALTREXONE-BUPROPION HCL ER 8-90 MG PO TB12: 2.0000 | ORAL_TABLET | Freq: Two times a day (BID) | ORAL | 2 refills | Status: DC

## 2018-07-21 NOTE — Progress Notes (Signed)
Subjective:    CC: Follow-up  HPI: Drew Cabrera returns, I treated him for prostatitis, he also passed a bladder stone.  Overall doing better.  Obesity: Plans to do a multidisciplinary weight loss approach with nutrition counseling, calorie counting, exercise.  He is agreeable to try Contrave.  Understands he would have to stop it if he takes his oxycodone.  There has been some yoyoing with phentermine.  I reviewed the past medical history, family history, social history, surgical history, and allergies today and no changes were needed.  Please see the problem list section below in epic for further details.  Past Medical History: Past Medical History:  Diagnosis Date  . Ganglion and cyst of synovium, tendon and bursa   . Hyperlipidemia   . Hypertension    Past Surgical History: Past Surgical History:  Procedure Laterality Date  . ORTHOPEDIC SURGERY     Social History: Social History   Socioeconomic History  . Marital status: Single    Spouse name: Not on file  . Number of children: Not on file  . Years of education: Not on file  . Highest education level: Not on file  Occupational History  . Not on file  Social Needs  . Financial resource strain: Not on file  . Food insecurity:    Worry: Not on file    Inability: Not on file  . Transportation needs:    Medical: Not on file    Non-medical: Not on file  Tobacco Use  . Smoking status: Former Smoker    Types: Cigarettes    Last attempt to quit: 04/21/2008    Years since quitting: 10.2  . Smokeless tobacco: Former Engineer, waterUser  Substance and Sexual Activity  . Alcohol use: No  . Drug use: Not on file  . Sexual activity: Yes  Lifestyle  . Physical activity:    Days per week: Not on file    Minutes per session: Not on file  . Stress: Not on file  Relationships  . Social connections:    Talks on phone: Not on file    Gets together: Not on file    Attends religious service: Not on file    Active member of club or organization:  Not on file    Attends meetings of clubs or organizations: Not on file    Relationship status: Not on file  Other Topics Concern  . Not on file  Social History Narrative  . Not on file   Family History: Family History  Problem Relation Age of Onset  . Thyroid disease Mother   . Stroke Father    Allergies: Allergies  Allergen Reactions  . Ioxaglate Anaphylaxis  . Metrizamide Shortness Of Breath  . Ivp Dye [Iodinated Diagnostic Agents]    Medications: See med rec.  Review of Systems: No fevers, chills, night sweats, weight loss, chest pain, or shortness of breath.   Objective:    General: Well Developed, well nourished, and in no acute distress.  Neuro: Alert and oriented x3, extra-ocular muscles intact, sensation grossly intact.  HEENT: Normocephalic, atraumatic, pupils equal round reactive to light, neck supple, no masses, no lymphadenopathy, thyroid nonpalpable.  Skin: Warm and dry, no rashes. Cardiac: Regular rate and rhythm, no murmurs rubs or gallops, no lower extremity edema.  Respiratory: Clear to auscultation bilaterally. Not using accessory muscles, speaking in full sentences.  Impression and Recommendations:    Prostatitis Prostatitis resolved, he also passed a fairly large bladder stone. Appointment coming up with urology tomorrow.  Obesity  We are going to try Contrave. He would have to stop the Contrave if he uses his oxycodone.  I spent 25 minutes with this patient, greater than 50% was face-to-face time counseling regarding the above diagnoses, specifically the mechanism, pharmacokinetics and pharmacology of Contrave. ___________________________________________ Ihor Austin. Benjamin Stain, M.D., ABFM., CAQSM. Primary Care and Sports Medicine [facility]  Adjunct Professor of Family Medicine  [employer]

## 2018-07-21 NOTE — Assessment & Plan Note (Signed)
Prostatitis resolved, he also passed a fairly large bladder stone. Appointment coming up with urology tomorrow.

## 2018-07-21 NOTE — Assessment & Plan Note (Signed)
We are going to try Contrave. He would have to stop the Contrave if he uses his oxycodone.

## 2018-07-22 ENCOUNTER — Telehealth: Payer: Self-pay | Admitting: Sports Medicine

## 2018-07-22 DIAGNOSIS — E669 Obesity, unspecified: Secondary | ICD-10-CM

## 2018-07-22 NOTE — Telephone Encounter (Signed)
Received fax from [employer] that Contrave requires a PA. Information has been sent to the insurance company. Awaiting determination.   

## 2018-07-22 NOTE — Telephone Encounter (Deleted)
Received fax from [employer] that Norco requires a PA. Information has been sent to the insurance company. Awaiting determination.

## 2018-07-22 NOTE — Telephone Encounter (Signed)
Received fax from [employer] that Oxycodone requires a PA. Information has been sent to the insurance company. Awaiting determination.   

## 2018-07-23 MED ORDER — BUPROPION HCL ER (XL) 150 MG PO TB24: ORAL_TABLET | ORAL | 3 refills | Status: DC

## 2018-07-23 MED ORDER — NALTREXONE HCL 50 MG PO TABS: ORAL_TABLET | ORAL | 3 refills | Status: DC

## 2018-07-23 NOTE — Telephone Encounter (Signed)
Patient was notified and voices understanding. He did not have any questions.

## 2018-07-23 NOTE — Telephone Encounter (Signed)
We are going to try separate prescriptions for bupropion and naltrexone.  Essentially I am breaking up branded Contrave into separate generics.

## 2018-07-23 NOTE — Telephone Encounter (Signed)
Received a fax from [employer] that Contrave is excluded from the patients benefit. Patient reports its too expensive to pay out of pocket and he is not sure about what else he can take .I see that he was on Phentermine in the past. Please advise.

## 2018-07-24 NOTE — Telephone Encounter (Signed)
Received fax from [employer] that Oxycodone was approved through 08/20/2018. Pharmacy aware.

## 2018-07-29 ENCOUNTER — Other Ambulatory Visit: Payer: Self-pay | Admitting: Sports Medicine

## 2018-07-29 DIAGNOSIS — R5383 Other fatigue: Secondary | ICD-10-CM

## 2018-08-25 ENCOUNTER — Ambulatory Visit: Payer: 59 | Admitting: Sports Medicine

## 2018-09-01 ENCOUNTER — Encounter: Payer: Self-pay | Admitting: Sports Medicine

## 2018-09-01 ENCOUNTER — Ambulatory Visit: Payer: Self-pay | Admitting: Sports Medicine

## 2018-10-06 ENCOUNTER — Ambulatory Visit: Payer: 59 | Admitting: Sports Medicine

## 2018-10-06 ENCOUNTER — Telehealth: Payer: Self-pay | Admitting: Sports Medicine

## 2018-10-06 DIAGNOSIS — M19171 Post-traumatic osteoarthritis, right ankle and foot: Secondary | ICD-10-CM | POA: Diagnosis not present

## 2018-10-06 DIAGNOSIS — S93601A Unspecified sprain of right foot, initial encounter: Secondary | ICD-10-CM | POA: Diagnosis not present

## 2018-10-06 MED ORDER — OXYCODONE-ACETAMINOPHEN 5-325 MG PO TABS: 1.0000 | ORAL_TABLET | Freq: Three times a day (TID) | ORAL | 0 refills | Status: DC | PRN

## 2018-10-06 NOTE — Progress Notes (Signed)
Subjective:    CC: Right ankle pain  HPI: This is a pleasant 48 year old male, he has had worsening pain in his right ankle, moderate, persistent.  Localized at the tibiotalar joint without radiation.  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  No new mechanical symptoms, trauma.  Desires repeat injection, he is moving to [place].  I reviewed the past medical history, family history, social history, surgical history, and allergies today and no changes were needed.  Please see the problem list section below in epic for further details.  Past Medical History: Past Medical History:  Diagnosis Date  . Ganglion and cyst of synovium, tendon and bursa   . Hyperlipidemia   . Hypertension    Past Surgical History: Past Surgical History:  Procedure Laterality Date  . ORTHOPEDIC SURGERY     Social History: Social History   Socioeconomic History  . Marital status: Single    Spouse name: Not on file  . Number of children: Not on file  . Years of education: Not on file  . Highest education level: Not on file  Occupational History  . Not on file  Social Needs  . Financial resource strain: Not on file  . Food insecurity:    Worry: Not on file    Inability: Not on file  . Transportation needs:    Medical: Not on file    Non-medical: Not on file  Tobacco Use  . Smoking status: Former Smoker    Types: Cigarettes    Last attempt to quit: 04/21/2008    Years since quitting: 10.4  . Smokeless tobacco: Former Engineer, water and Sexual Activity  . Alcohol use: No  . Drug use: Not on file  . Sexual activity: Yes  Lifestyle  . Physical activity:    Days per week: Not on file    Minutes per session: Not on file  . Stress: Not on file  Relationships  . Social connections:    Talks on phone: Not on file    Gets together: Not on file    Attends religious service: Not on file    Active member  of club or organization: Not on file    Attends meetings of clubs or organizations: Not on file    Relationship status: Not on file  Other Topics Concern  . Not on file  Social History Narrative  . Not on file   Family History: Family History  Problem Relation Age of Onset  . Thyroid disease Mother   . Stroke Father    Allergies: Allergies  Allergen Reactions  . Ioxaglate Anaphylaxis  . Metrizamide Shortness Of Breath  . Ivp Dye [Iodinated Diagnostic Agents]    Medications: See med rec.  Review of Systems: No fevers, chills, night sweats, weight loss, chest pain, or shortness of breath.   Objective:    General: Well Developed, well nourished, and in no acute distress.  Neuro: Alert and oriented x3, extra-ocular muscles intact, sensation grossly intact.  HEENT: Normocephalic, atraumatic, pupils equal round reactive to light, neck supple, no masses, no lymphadenopathy, thyroid nonpalpable.  Skin: Warm and dry, no rashes. Cardiac: Regular rate and rhythm, no murmurs rubs or gallops, no lower extremity edema.  Respiratory: Clear to auscultation bilaterally. Not using accessory muscles, speaking in full sentences. Right ankle: Minimally swollen, tender to palpation over the tibiotalar joint. Range of motion is full in all directions. Strength is 5/5  in all directions. Stable lateral and medial ligaments; squeeze test and kleiger test unremarkable; Talar dome nontender; No pain at base of 5th MT; No tenderness over cuboid; No tenderness over N spot or navicular prominence No tenderness on posterior aspects of lateral and medial malleolus No sign of peroneal tendon subluxations; Negative tarsal tunnel tinel's Able to walk 4 steps.  Procedure: Real-time Ultrasound Guided injection of the right ankle Device: GE Logiq E  Verbal informed consent obtained.  Time-out conducted.  Noted no overlying erythema, induration, or other signs of local infection.  Skin prepped in a  sterile fashion.  Local anesthesia: Topical Ethyl chloride.  With sterile technique and under real time ultrasound guidance:  25-gauge needle advanced into the tibiotalar joint, the joint was highly degenerated so it was very difficult, ultrasound guidance was absolutely essential here, I then injected 1 cc Kenalog 40, 1 cc lidocaine, 1 cc bupivacaine. Completed without difficulty  Pain immediately resolved suggesting accurate placement of the medication.  Advised to call if fevers/chills, erythema, induration, drainage, or persistent bleeding.  Images permanently stored and available for review in the ultrasound unit.  Impression: Technically successful ultrasound guided injection.  Impression and Recommendations:    Post-traumatic osteoarthritis of right ankle Repeat injection today, previous injection was in December, he is moving to [place]. I am going to give him a final additional refill of oxycodone.   ___________________________________________ Ihor Austinhomas J. Benjamin Stainhekkekandam, M.D., ABFM., CAQSM. Primary Care and Sports Medicine [facility]  Adjunct Professor of Family Medicine  [employer]

## 2018-10-06 NOTE — Assessment & Plan Note (Signed)
Repeat injection today, previous injection was in December, he is moving to [place]. I am going to give him a final additional refill of oxycodone.

## 2018-10-06 NOTE — Telephone Encounter (Signed)
Pt states after this week, he will be moving to [place]  So he would like to get in one last time for a shot in his ankle?

## 2018-10-06 NOTE — Telephone Encounter (Signed)
Pt called and would like an appointment for a shot in his ankle this week but I figured I needed to ask you if he can schedule a appointment with you?

## 2018-10-06 NOTE — Telephone Encounter (Signed)
Of course he can!  He's leaving?!?

## 2019-02-04 ENCOUNTER — Other Ambulatory Visit: Payer: Self-pay | Admitting: *Deleted

## 2019-02-04 DIAGNOSIS — E7800 Pure hypercholesterolemia, unspecified: Secondary | ICD-10-CM

## 2019-02-04 DIAGNOSIS — E669 Obesity, unspecified: Secondary | ICD-10-CM

## 2019-02-04 DIAGNOSIS — S93601A Unspecified sprain of right foot, initial encounter: Secondary | ICD-10-CM

## 2019-02-04 DIAGNOSIS — R5383 Other fatigue: Secondary | ICD-10-CM

## 2019-02-04 DIAGNOSIS — M19171 Post-traumatic osteoarthritis, right ankle and foot: Secondary | ICD-10-CM

## 2019-02-04 DIAGNOSIS — E782 Mixed hyperlipidemia: Secondary | ICD-10-CM

## 2019-02-04 MED ORDER — OXYCODONE-ACETAMINOPHEN 5-325 MG PO TABS: 1.0000 | ORAL_TABLET | Freq: Three times a day (TID) | ORAL | 0 refills | Status: AC | PRN

## 2019-02-04 MED ORDER — DICLOFENAC SODIUM 75 MG PO TBEC: 75.0000 mg | DELAYED_RELEASE_TABLET | Freq: Two times a day (BID) | ORAL | 0 refills | Status: DC

## 2019-02-04 MED ORDER — TOPIRAMATE 50 MG PO TABS: 50.0000 mg | ORAL_TABLET | Freq: Every day | ORAL | 0 refills | Status: DC

## 2019-02-04 MED ORDER — LEVOTHYROXINE SODIUM 88 MCG PO TABS: 88.0000 ug | ORAL_TABLET | Freq: Every day | ORAL | 0 refills | Status: DC

## 2019-02-04 MED ORDER — VORTIOXETINE HBR 20 MG PO TABS: 20.0000 mg | ORAL_TABLET | Freq: Every day | ORAL | 0 refills | Status: DC

## 2019-02-04 MED ORDER — VASCEPA 1 G PO CAPS: 1.0000 | ORAL_CAPSULE | Freq: Two times a day (BID) | ORAL | 0 refills | Status: DC

## 2019-02-04 MED ORDER — FENOFIBRATE 145 MG PO TABS: 145.0000 mg | ORAL_TABLET | Freq: Every day | ORAL | 0 refills | Status: DC

## 2019-02-04 MED ORDER — PRAVASTATIN SODIUM 40 MG PO TABS: 40.0000 mg | ORAL_TABLET | Freq: Every day | ORAL | 0 refills | Status: DC

## 2019-02-04 MED ORDER — LISINOPRIL 20 MG PO TABS: 20.0000 mg | ORAL_TABLET | Freq: Every day | ORAL | 0 refills | Status: AC

## 2019-02-17 ENCOUNTER — Telehealth: Payer: Self-pay | Admitting: Sports Medicine

## 2019-02-17 NOTE — Telephone Encounter (Signed)
Approved today (Oxycodone) CaseId:[id number];Status:Approved;Review Type:Prior Auth;Coverage Start Date:01/18/2019;Coverage End Date:03/19/2019; Pharmacy aware.

## 2019-04-19 ENCOUNTER — Other Ambulatory Visit: Payer: Self-pay | Admitting: Sports Medicine

## 2019-04-19 DIAGNOSIS — R5383 Other fatigue: Secondary | ICD-10-CM

## 2019-04-26 IMAGING — US US RENAL
1 series · 14 of 25 positions shown · non-contrast
Comparison: None.

CLINICAL DATA: Initial evaluation for microscopic hematuria, acute
prostatitis.

EXAM:
RENAL / URINARY TRACT ULTRASOUND COMPLETE

[Series 1: us renal · 0.19mm/px · 14 of 25 slices shown]
[im 1/25]
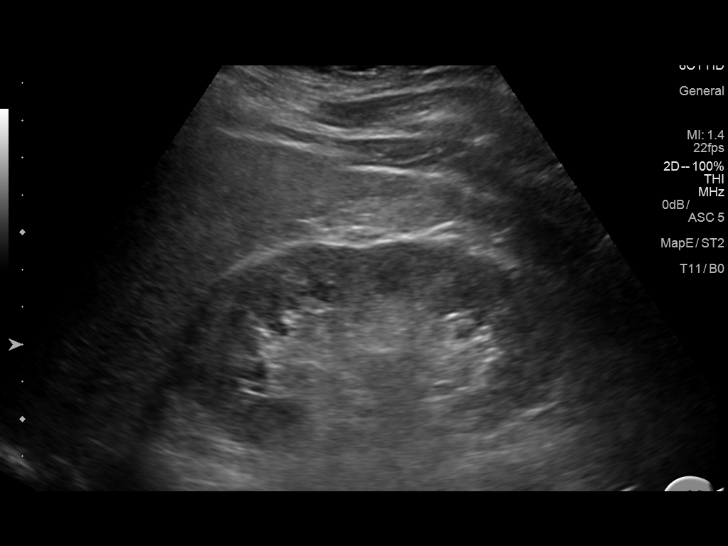
[im 3/25]
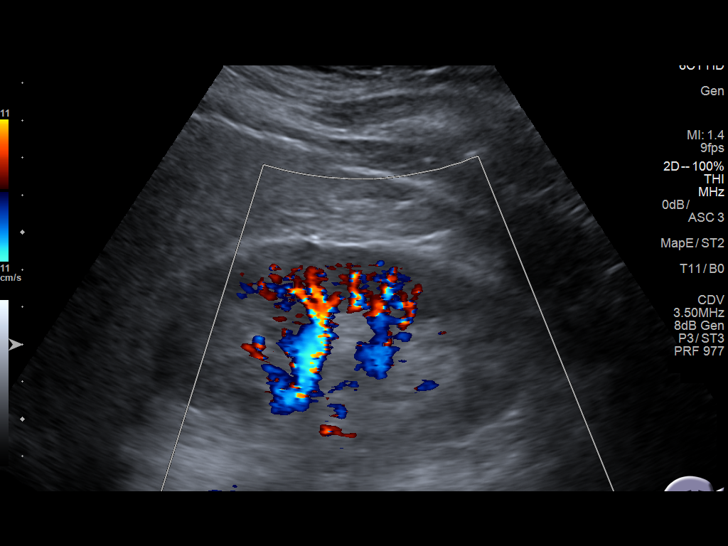
[im 5/25]
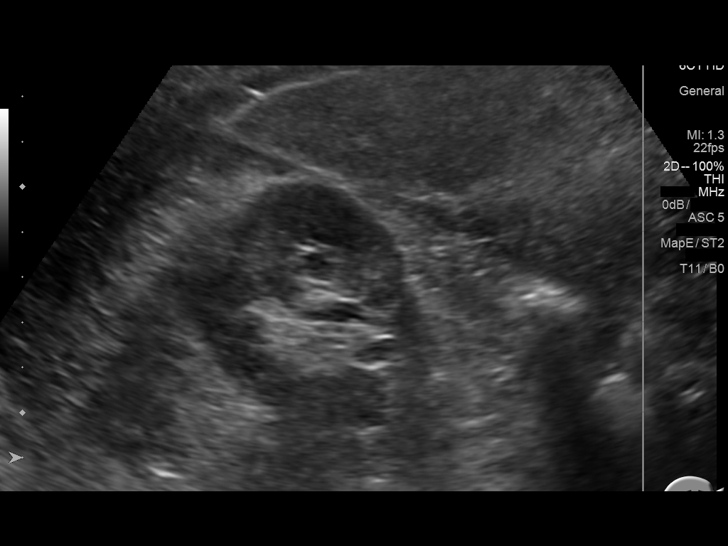
[im 7/25]
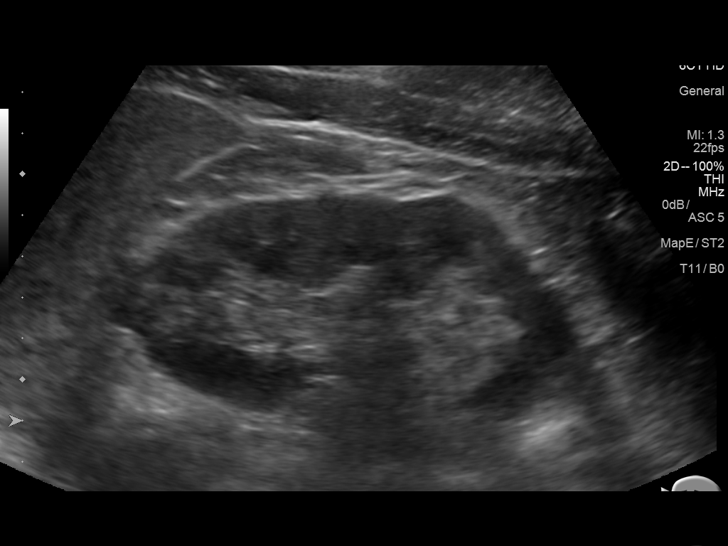
[im 9/25]
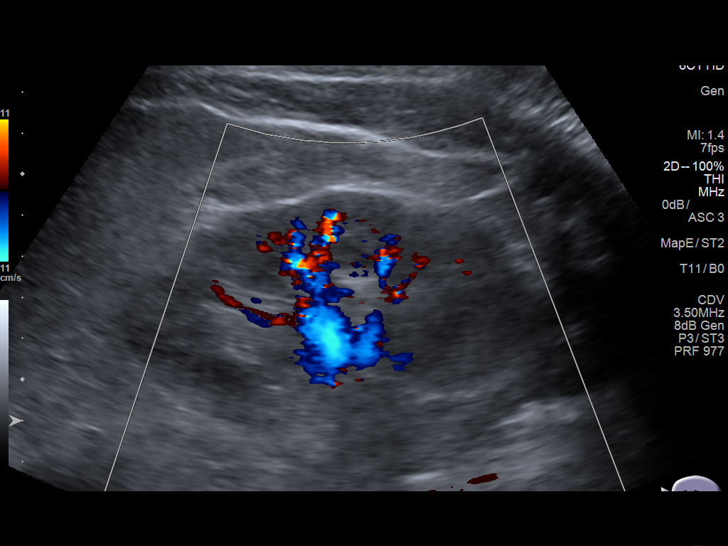
[im 10/25]
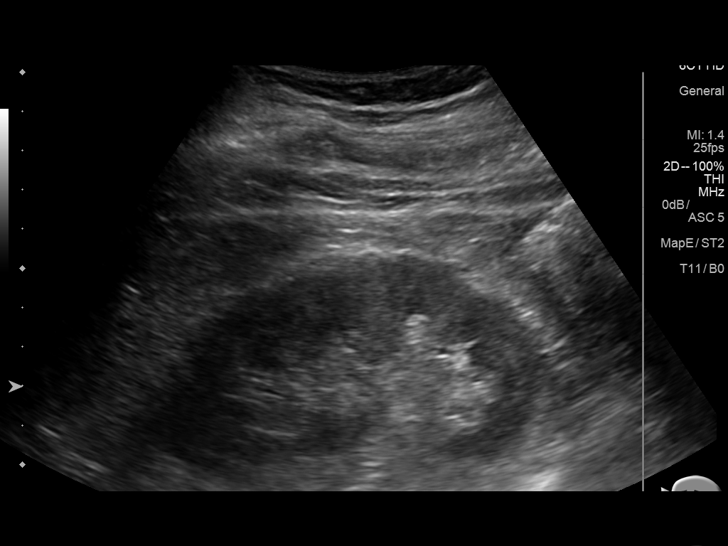
[im 12/25]
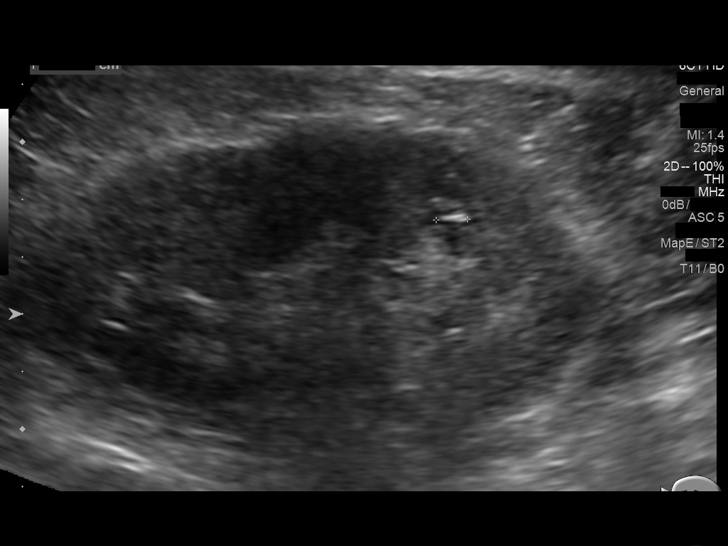
[im 14/25]
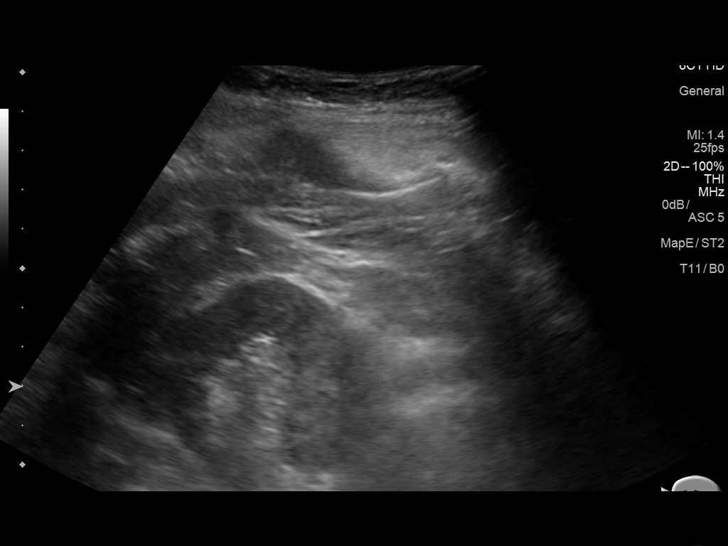
[im 16/25]
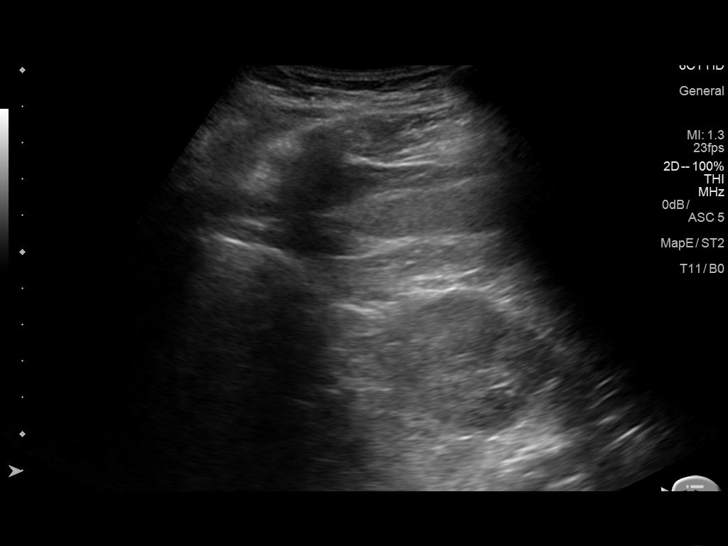
[im 17/25]
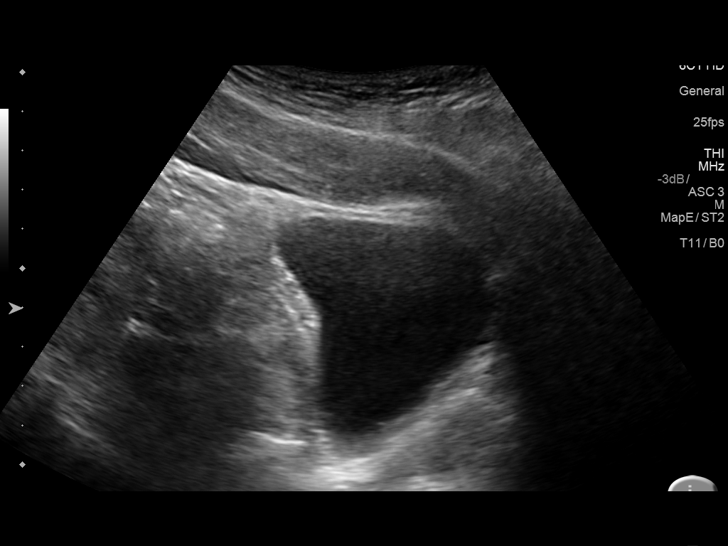
[im 19/25]
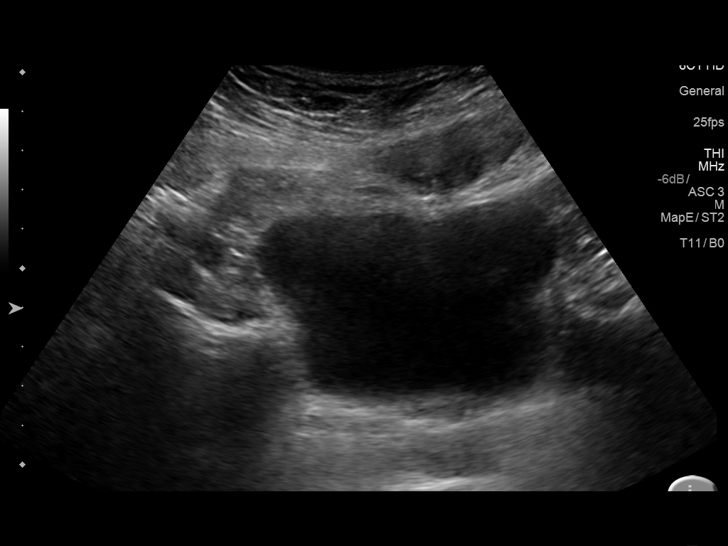
[im 21/25]
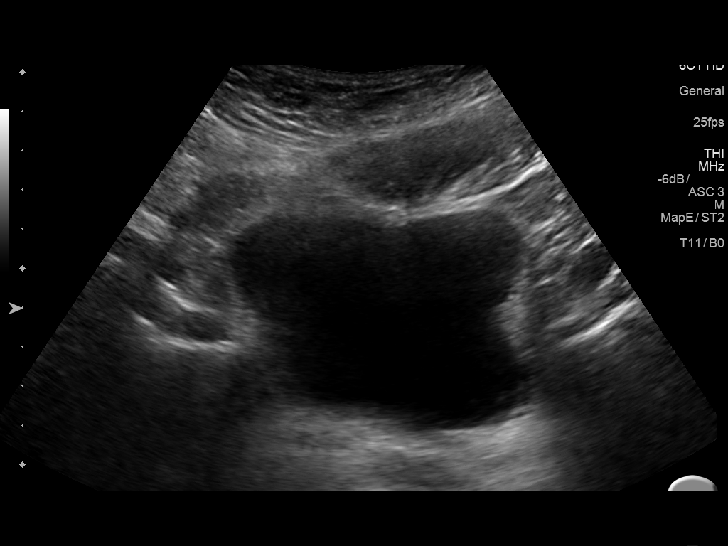
[im 23/25]
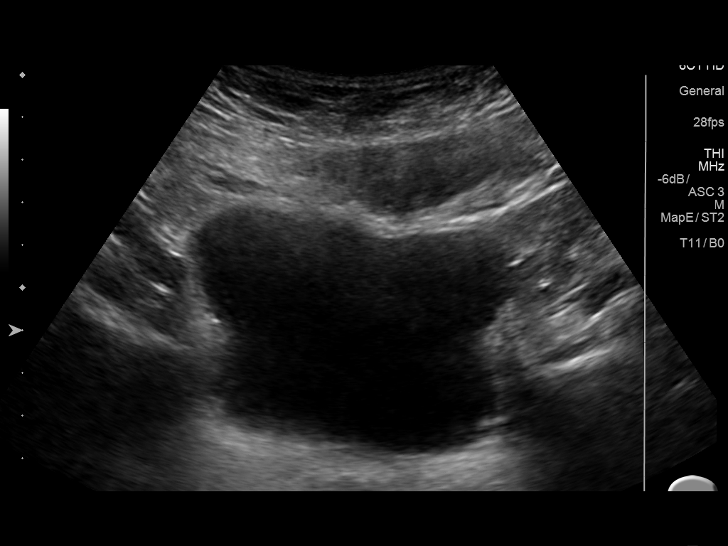
[im 25/25]
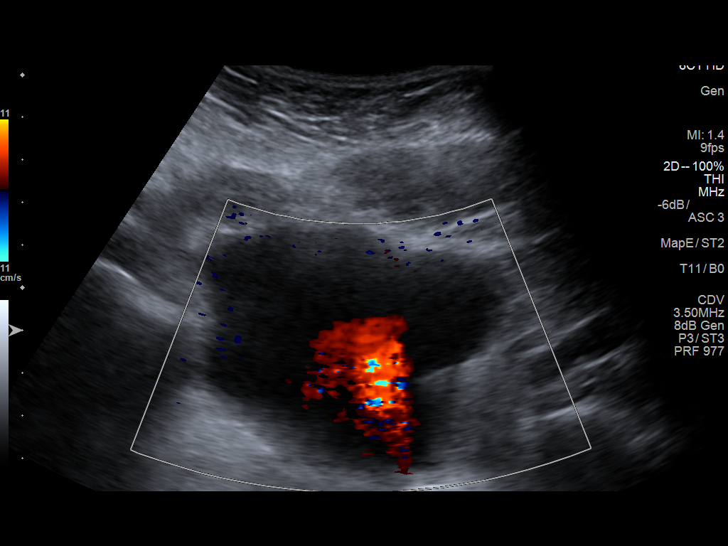

[14 of 25 positions shown; findings below may reference images not displayed]

FINDINGS: Right Kidney:

Length: 10.4 cm. Echogenicity within normal limits. No mass or
hydronephrosis visualized.

Left Kidney:

Length: 10.9 cm. Echogenicity within normal limits. 5 mm echogenic
focus within the lower pole most consistent with a small stone. No
mass or hydronephrosis visualized.

Bladder:

Appears normal for degree of bladder distention. Bilateral jets
visualized.
IMPRESSION: 1. 5 mm nonobstructive left renal nephrolithiasis.
2. Otherwise normal renal ultrasound.

## 2019-05-11 ENCOUNTER — Other Ambulatory Visit: Payer: Self-pay | Admitting: Sports Medicine

## 2019-05-11 DIAGNOSIS — E669 Obesity, unspecified: Secondary | ICD-10-CM

## 2019-05-11 DIAGNOSIS — E782 Mixed hyperlipidemia: Secondary | ICD-10-CM

## 2019-05-11 DIAGNOSIS — E7800 Pure hypercholesterolemia, unspecified: Secondary | ICD-10-CM

## 2019-05-12 ENCOUNTER — Other Ambulatory Visit: Payer: Self-pay | Admitting: Sports Medicine

## 2019-11-09 ENCOUNTER — Other Ambulatory Visit: Payer: Self-pay | Admitting: Sports Medicine

## 2019-11-09 DIAGNOSIS — E782 Mixed hyperlipidemia: Secondary | ICD-10-CM

## 2019-11-12 ENCOUNTER — Other Ambulatory Visit: Payer: Self-pay | Admitting: Sports Medicine

## 2019-12-04 ENCOUNTER — Other Ambulatory Visit: Payer: Self-pay | Admitting: Sports Medicine

## 2020-01-12 ENCOUNTER — Other Ambulatory Visit: Payer: Self-pay | Admitting: Sports Medicine

## 2020-01-18 ENCOUNTER — Other Ambulatory Visit: Payer: Self-pay | Admitting: Sports Medicine

## 2020-01-18 DIAGNOSIS — E669 Obesity, unspecified: Secondary | ICD-10-CM
